# Patient Record
Sex: Male | Born: 1935 | Race: White | Hispanic: No | Smoking: Never smoker
Health system: Southern US, Community
[De-identification: ages and names within clinical notes are randomized; demographics above are authoritative.]

## PROBLEM LIST (undated history)

## (undated) DIAGNOSIS — Z7901 Long term (current) use of anticoagulants: Secondary | ICD-10-CM

## (undated) DIAGNOSIS — I2510 Atherosclerotic heart disease of native coronary artery without angina pectoris: Secondary | ICD-10-CM

## (undated) DIAGNOSIS — Z9981 Dependence on supplemental oxygen: Secondary | ICD-10-CM

## (undated) DIAGNOSIS — I350 Nonrheumatic aortic (valve) stenosis: Secondary | ICD-10-CM

## (undated) DIAGNOSIS — I714 Abdominal aortic aneurysm, without rupture, unspecified: Secondary | ICD-10-CM

## (undated) DIAGNOSIS — I4891 Unspecified atrial fibrillation: Secondary | ICD-10-CM

## (undated) HISTORY — DX: Long term (current) use of anticoagulants: Z79.01

## (undated) HISTORY — DX: Unspecified atrial fibrillation: I48.91

## (undated) HISTORY — DX: Nonrheumatic aortic (valve) stenosis: I35.0

## (undated) HISTORY — DX: Abdominal aortic aneurysm, without rupture: I71.4

## (undated) HISTORY — DX: Atherosclerotic heart disease of native coronary artery without angina pectoris: I25.10

## (undated) HISTORY — DX: Abdominal aortic aneurysm, without rupture, unspecified: I71.40

---

## 1992-11-14 HISTORY — PX: CORONARY ARTERY BYPASS GRAFT: SHX141

## 1995-11-15 HISTORY — PX: COLONOSCOPY: SHX174

## 2002-02-06 ENCOUNTER — Ambulatory Visit (HOSPITAL_COMMUNITY): Admission: RE | Admit: 2002-02-06 | Discharge: 2002-02-06 | Payer: Self-pay | Admitting: Pulmonary Disease

## 2002-07-12 ENCOUNTER — Ambulatory Visit (HOSPITAL_COMMUNITY): Admission: RE | Admit: 2002-07-12 | Discharge: 2002-07-12 | Payer: Self-pay | Admitting: Cardiology

## 2003-01-13 ENCOUNTER — Ambulatory Visit (HOSPITAL_COMMUNITY): Admission: RE | Admit: 2003-01-13 | Discharge: 2003-01-13 | Payer: Self-pay | Admitting: *Deleted

## 2003-10-15 ENCOUNTER — Ambulatory Visit (HOSPITAL_COMMUNITY): Admission: RE | Admit: 2003-10-15 | Discharge: 2003-10-15 | Payer: Self-pay | Admitting: Pulmonary Disease

## 2004-11-11 ENCOUNTER — Ambulatory Visit: Payer: Self-pay | Admitting: *Deleted

## 2005-10-28 ENCOUNTER — Ambulatory Visit: Payer: Self-pay | Admitting: *Deleted

## 2005-11-10 ENCOUNTER — Other Ambulatory Visit: Admission: RE | Admit: 2005-11-10 | Discharge: 2005-11-10 | Payer: Self-pay | Admitting: Dermatology

## 2006-05-08 ENCOUNTER — Ambulatory Visit (HOSPITAL_COMMUNITY): Admission: RE | Admit: 2006-05-08 | Discharge: 2006-05-08 | Payer: Self-pay | Admitting: Pulmonary Disease

## 2006-05-15 ENCOUNTER — Ambulatory Visit (HOSPITAL_COMMUNITY): Admission: RE | Admit: 2006-05-15 | Discharge: 2006-05-15 | Payer: Self-pay | Admitting: Pulmonary Disease

## 2006-12-25 ENCOUNTER — Ambulatory Visit: Payer: Self-pay | Admitting: Cardiovascular Disease

## 2007-01-09 ENCOUNTER — Ambulatory Visit (HOSPITAL_COMMUNITY): Admission: RE | Admit: 2007-01-09 | Discharge: 2007-01-09 | Payer: Self-pay | Admitting: Ophthalmology

## 2007-06-29 ENCOUNTER — Ambulatory Visit: Payer: Self-pay | Admitting: Vascular Surgery

## 2007-07-04 ENCOUNTER — Ambulatory Visit: Payer: Self-pay | Admitting: Vascular Surgery

## 2007-12-24 ENCOUNTER — Ambulatory Visit (HOSPITAL_COMMUNITY): Admission: RE | Admit: 2007-12-24 | Discharge: 2007-12-24 | Payer: Self-pay | Admitting: Pulmonary Disease

## 2008-01-09 ENCOUNTER — Ambulatory Visit: Payer: Self-pay | Admitting: Cardiovascular Disease

## 2008-01-12 ENCOUNTER — Ambulatory Visit (HOSPITAL_COMMUNITY): Admission: RE | Admit: 2008-01-12 | Discharge: 2008-01-12 | Payer: Self-pay | Admitting: Pulmonary Disease

## 2008-01-17 ENCOUNTER — Ambulatory Visit: Payer: Self-pay | Admitting: Cardiology

## 2008-01-17 ENCOUNTER — Ambulatory Visit (HOSPITAL_COMMUNITY): Admission: RE | Admit: 2008-01-17 | Discharge: 2008-01-17 | Payer: Self-pay | Admitting: Cardiovascular Disease

## 2008-06-18 ENCOUNTER — Ambulatory Visit: Payer: Self-pay | Admitting: Vascular Surgery

## 2008-06-19 ENCOUNTER — Ambulatory Visit: Payer: Self-pay | Admitting: Cardiology

## 2008-07-04 ENCOUNTER — Ambulatory Visit: Payer: Self-pay | Admitting: Cardiology

## 2009-07-03 ENCOUNTER — Telehealth (INDEPENDENT_AMBULATORY_CARE_PROVIDER_SITE_OTHER): Payer: Self-pay | Admitting: *Deleted

## 2009-07-03 ENCOUNTER — Ambulatory Visit: Payer: Self-pay | Admitting: Vascular Surgery

## 2009-07-07 ENCOUNTER — Telehealth: Payer: Self-pay | Admitting: Cardiology

## 2010-01-27 ENCOUNTER — Ambulatory Visit: Payer: Self-pay | Admitting: Cardiology

## 2010-08-31 ENCOUNTER — Ambulatory Visit (HOSPITAL_COMMUNITY)
Admission: RE | Admit: 2010-08-31 | Discharge: 2010-08-31 | Payer: Self-pay | Admitting: Physical Medicine and Rehabilitation

## 2010-11-14 DIAGNOSIS — I4891 Unspecified atrial fibrillation: Secondary | ICD-10-CM

## 2010-11-14 HISTORY — DX: Unspecified atrial fibrillation: I48.91

## 2010-11-24 ENCOUNTER — Inpatient Hospital Stay (HOSPITAL_COMMUNITY)
Admission: EM | Admit: 2010-11-24 | Discharge: 2010-11-25 | Payer: Self-pay | Source: Home / Self Care | Attending: Pulmonary Disease | Admitting: Pulmonary Disease

## 2010-11-25 ENCOUNTER — Encounter (INDEPENDENT_AMBULATORY_CARE_PROVIDER_SITE_OTHER): Payer: Self-pay | Admitting: Pulmonary Disease

## 2010-11-29 LAB — CBC
HCT: 35.8 % — ABNORMAL LOW (ref 39.0–52.0)
HCT: 33.9 % — ABNORMAL LOW (ref 39.0–52.0)
Hemoglobin: 12.7 g/dL — ABNORMAL LOW (ref 13.0–17.0)
Hemoglobin: 12.4 g/dL — ABNORMAL LOW (ref 13.0–17.0)
MCH: 32.5 pg (ref 26.0–34.0)
MCH: 33.2 pg (ref 26.0–34.0)
MCHC: 35.5 g/dL (ref 30.0–36.0)
MCHC: 36.6 g/dL — ABNORMAL HIGH (ref 30.0–36.0)
MCV: 91.6 fL (ref 78.0–100.0)
MCV: 90.9 fL (ref 78.0–100.0)
Platelets: 109 10*3/uL — ABNORMAL LOW (ref 150–400)
Platelets: 114 10*3/uL — ABNORMAL LOW (ref 150–400)
RBC: 3.91 MIL/uL — ABNORMAL LOW (ref 4.22–5.81)
RBC: 3.73 MIL/uL — ABNORMAL LOW (ref 4.22–5.81)
RDW: 12.9 % (ref 11.5–15.5)
RDW: 13 % (ref 11.5–15.5)
WBC: 4 10*3/uL (ref 4.0–10.5)
WBC: 5.3 10*3/uL (ref 4.0–10.5)

## 2010-11-29 LAB — POCT CARDIAC MARKERS
CKMB, poc: 2.6 ng/mL (ref 1.0–8.0)
Myoglobin, poc: 132 ng/mL (ref 12–200)
Troponin i, poc: <0.05 ng/mL (ref 0.00–0.09)

## 2010-11-29 LAB — BASIC METABOLIC PANEL
BUN: 28 mg/dL — ABNORMAL HIGH (ref 6–23)
BUN: 24 mg/dL — ABNORMAL HIGH (ref 6–23)
CO2: 26 mEq/L (ref 19–32)
CO2: 22 mEq/L (ref 19–32)
Calcium: 8.9 mg/dL (ref 8.4–10.5)
Calcium: 8.4 mg/dL (ref 8.4–10.5)
Chloride: 107 mEq/L (ref 96–112)
Chloride: 113 mEq/L — ABNORMAL HIGH (ref 96–112)
Creatinine, Ser: 1.41 mg/dL (ref 0.4–1.5)
Creatinine, Ser: 1.36 mg/dL (ref 0.4–1.5)
GFR calc Af Amer: 57 mL/min — ABNORMAL LOW (ref >60)
GFR calc Af Amer: 59 mL/min — ABNORMAL LOW (ref >60)
GFR calc non Af Amer: 47 mL/min — ABNORMAL LOW (ref >60)
GFR calc non Af Amer: 49 mL/min — ABNORMAL LOW (ref >60)
Glucose, Bld: 113 mg/dL — ABNORMAL HIGH (ref 70–99)
Glucose, Bld: 91 mg/dL (ref 70–99)
Potassium: 4.2 mEq/L (ref 3.5–5.1)
Potassium: 3.9 mEq/L (ref 3.5–5.1)
Sodium: 140 mEq/L (ref 135–145)
Sodium: 140 mEq/L (ref 135–145)

## 2010-11-29 LAB — CARDIAC PANEL(CRET KIN+CKTOT+MB+TROPI)
CK, MB: 4.5 ng/mL — ABNORMAL HIGH (ref 0.3–4.0)
Relative Index: 4.4 — ABNORMAL HIGH (ref 0.0–2.5)
Total CK: 103 U/L (ref 7–232)
Troponin I: 0.3 ng/mL — ABNORMAL HIGH (ref 0.00–0.06)

## 2010-11-29 LAB — DIFFERENTIAL
Basophils Absolute: 0 10*3/uL (ref 0.0–0.1)
Basophils Relative: 1 % (ref 0–1)
Eosinophils Absolute: 0.2 10*3/uL (ref 0.0–0.7)
Eosinophils Relative: 6 % — ABNORMAL HIGH (ref 0–5)
Lymphocytes Relative: 30 % (ref 12–46)
Lymphs Abs: 1.2 10*3/uL (ref 0.7–4.0)
Monocytes Absolute: 0.6 10*3/uL (ref 0.1–1.0)
Monocytes Relative: 15 % — ABNORMAL HIGH (ref 3–12)
Neutro Abs: 2 10*3/uL (ref 1.7–7.7)
Neutrophils Relative %: 49 % (ref 43–77)

## 2010-11-29 LAB — HEPARIN LEVEL (UNFRACTIONATED)
Heparin Unfractionated: 0.22 IU/mL — ABNORMAL LOW (ref 0.30–0.70)
Heparin Unfractionated: 0.1 IU/mL — ABNORMAL LOW (ref 0.30–0.70)

## 2010-11-29 LAB — HEPATIC FUNCTION PANEL
ALT: 15 U/L (ref 0–53)
AST: 19 U/L (ref 0–37)
Albumin: 3.1 g/dL — ABNORMAL LOW (ref 3.5–5.2)
Alkaline Phosphatase: 63 U/L (ref 39–117)
Bilirubin, Direct: 0.1 mg/dL (ref 0.0–0.3)
Indirect Bilirubin: 0.7 mg/dL (ref 0.3–0.9)
Total Bilirubin: 0.8 mg/dL (ref 0.3–1.2)
Total Protein: 5.2 g/dL — ABNORMAL LOW (ref 6.0–8.3)

## 2010-11-29 LAB — PROTIME-INR
INR: 1.04 (ref 0.00–1.49)
Prothrombin Time: 13.8 seconds (ref 11.6–15.2)

## 2010-11-29 LAB — MRSA PCR SCREENING: MRSA by PCR: NEGATIVE

## 2010-11-29 LAB — LIPID PANEL
Cholesterol: 120 mg/dL (ref 0–200)
HDL: 48 mg/dL (ref >39)
LDL Cholesterol: 62 mg/dL (ref 0–99)
Total CHOL/HDL Ratio: 2.5 RATIO
Triglycerides: 49 mg/dL (ref <150)
VLDL: 10 mg/dL (ref 0–40)

## 2010-11-29 LAB — BRAIN NATRIURETIC PEPTIDE: Pro B Natriuretic peptide (BNP): 147 pg/mL — ABNORMAL HIGH (ref 0.0–100.0)

## 2010-11-29 LAB — CK TOTAL AND CKMB (NOT AT ARMC)
CK, MB: 3.3 ng/mL (ref 0.3–4.0)
Relative Index: 3.3 — ABNORMAL HIGH (ref 0.0–2.5)
Total CK: 100 U/L (ref 7–232)

## 2010-11-29 LAB — APTT: aPTT: 25 seconds (ref 24–37)

## 2010-11-29 LAB — D-DIMER, QUANTITATIVE: D-Dimer, Quant: 1.02 ug/mL-FEU — ABNORMAL HIGH (ref 0.00–0.48)

## 2010-12-07 ENCOUNTER — Encounter: Payer: Self-pay | Admitting: Cardiology

## 2010-12-16 ENCOUNTER — Telehealth: Payer: Self-pay | Admitting: Cardiology

## 2010-12-16 NOTE — Assessment & Plan Note (Signed)
Summary: ROV   Visit Type:  Follow-up Primary Provider:  DR.EDWARD HAWKINS  CC:  NO CARDIOLOGY COMPLAINTS.  History of Present Illness: [name] returns today for evaluation and management of his coronary artery disease, history of coronary artery bypass in 1994, moderate aortic stenosis, hypertension, nonobstructive carotid disease followed by vascular surgery, abdominal aortic aneurysm followed by vascular surgery, and lower extremity edema from venous insufficiency.  He is 77 years old and still does his yard work. He denies any angina or ischemic symptoms. He's had no presyncope, syncope, palpitations. He denies any dyspnea on exertion.  He still has a lot of lower shin edema and has to wear support hose.  His blood work is followed by his primary care Dr. Juanetta Gosling  Current Medications (verified): 1)  Diltiazem Hcl Er Beads 240 Mg Xr24h-Cap (Diltiazem Hcl Er Beads) .... Take 1 Tab Daily 2)  Nitroglycerin 0.4 Mg Subl (Nitroglycerin) .... Place 1 Tablet Under Tongue As Directed 3)  Simvastatin 20 Mg Tabs (Simvastatin) .... Take 1 Tab Daily 4)  Daily Multi  Tabs (Multiple Vitamins-Minerals) .... Take 1 Tab Daily 5)  Fish Oil 300 Mg Caps (Omega-3 Fatty Acids) .... Take 1 Tab Daily 6)  Aspir-Trin 325 Mg Tbec (Aspirin) .... Take 1/3 of Asa 7)  Saw Palmetto 450 Mg Caps (Saw Palmetto (Serenoa Repens)) .... Take 1 Tab Daily 8)  Zolpidem Tartrate 10 Mg Tabs (Zolpidem Tartrate) .... Take 1 Tab At Bedtime 9)  Hydrocodone-Acetaminophen 5-500 Mg Tabs (Hydrocodone-Acetaminophen) .... Take Prn 10)  Flomax 0.4 Mg Caps (Tamsulosin Hcl) .... Take 1 Tab Daily  Allergies (verified): No Known Drug Allergies  Review of Systems       negative other than history of present illness  Vital Signs:  Patient profile:   77 year old male Height:      67 inches Weight:      158 pounds Pulse rate:   68 / minute BP sitting:   115 / 60  (right arm)  Vitals Entered By: Dreama Saa, CNA (January 27, 2010  3:45 PM)  Physical Exam  General:  looks younger than stated age Head:  normocephalic and atraumatic Neck:  Neck supple, no JVD. No masses, thyromegaly or abnormal cervical nodes. Chest Yarden Hillis:  no deformities or breast masses noted Lungs:  Clear bilaterally to auscultation and percussion. Heart:  regular rate and rhythm, normal S1, 3/6 systolic murmur consistent with aortic stenosis. S2 splits minimally. No significant diastolic murmur heard. Bilateral sounds at the bases neck is referred or bruits. Msk:  decreased ROM.   Pulses:  diminished in the lower extremities Extremities:  2+ left pedal edema and 2+ right pedal edema.  support Neurologic:  Alert and oriented x 3. Skin:  Intact without lesions or rashes. Psych:  Normal affect.   Problems:  Medical Problems Added: 1)  Dx of Aortic Stenosis/ Insufficiency, Non-rheumatic  (ICD-424.1) 2)  Dx of Cad, Artery Bypass Graft  (ICD-414.04)  Impression & Recommendations:  Problem # 1:  CAD, ARTERY BYPASS GRAFT (ICD-414.04) Assessment Unchanged  The following medications were removed from the medication list:    Lisinopril 10 Mg Tabs (Lisinopril) .Marland Kitchen... Take 1 tablet by mouth once a day His updated medication list for this problem includes:    Diltiazem Hcl Er Beads 240 Mg Xr24h-cap (Diltiazem hcl er beads) .Marland Kitchen... Take 1 tab daily    Nitroglycerin 0.4 Mg Subl (Nitroglycerin) .Marland Kitchen... Place 1 tablet under tongue as directed    Aspir-trin 325 Mg Tbec (Aspirin) .Marland Kitchen... Take 1/3  of asa  Problem # 2:  AORTIC STENOSIS/ INSUFFICIENCY, NON-RHEUMATIC (ICD-424.1) Assessment: Unchanged  The following medications were removed from the medication list:    Hydrochlorothiazide 12.5 Mg Tabs (Hydrochlorothiazide) .Marland Kitchen... Take 1 tablet by mouth once a day    Lisinopril 10 Mg Tabs (Lisinopril) .Marland Kitchen... Take 1 tablet by mouth once a day His updated medication list for this problem includes:    Nitroglycerin 0.4 Mg Subl (Nitroglycerin) .Marland Kitchen... Place 1 tablet under  tongue as directed  Patient Instructions: 1)  Your physician recommends that you schedule a follow-up appointment in: 12 months 2)  Your physician recommends that you continue on your current medications as directed. Please refer to the Current Medication list given to you today.

## 2010-12-20 NOTE — Consult Note (Addendum)
NAME:  Troy Hinton, Troy Hinton NO.:  [id number]  MEDICAL RECORD NO.:  [id number]          PATIENT TYPE:  INP  LOCATION:  A232                          FACILITY:  [facility]  PHYSICIAN:  Gerrit Friends. Dietrich Pates, MD, FACCDATE OF BIRTH:  Jan 04, 1917  DATE OF CONSULTATION:  11/24/2010 DATE OF DISCHARGE:                                CONSULTATION   PRIMARY CARDIOLOGIST:  Jesse Sans. Wall, MD, [facility].  PRIMARY CARE PHYSICIAN:  Edward L. Juanetta Gosling, M.D.  REASON FOR CONSULTATION:  Chest pain, Afib, RVR.  HISTORY OF PRESENT ILLNESS:  This is a 77 year old Caucasian male with history of coronary artery disease, CABG in 1994, AAA, hypertension who awoke with complaints of severe left-sided chest pain radiating to his left shoulder, described as sharp.  He took nitroglycerin x4.  He states that he took his blood pressure and it was found to be very elevated at over 200 systolically.  On arrival to the emergency room, the patient states the pain was relieved before arriving.  EKG completed revealing AFib with RVR with a rate of 114 beats per minute.  The patient was given 10 mg of IV Cardizem and started on drip at 5, which has now been titrated to 10 mg an hour for continued elevation in heart rate into the 110s and 120s.  The patient is currently resting without any complaints. His breathing status is stable and he has had no further recurrence of chest pain.  REVIEW OF SYSTEMS:  Positive for chest pain, shortness of breath.  All other systems are reviewed and are found to be negative.  PAST MEDICAL HISTORY: 1. CAD status pars five-vessel CABG in 1994, records are being     obtained for specifics. 2. Moderate aortic valve stenosis.  a:  Most recent echocardiogram dated March 2004 with mild aortic valve     stenosis, valve area of 1.6 sq cm with a peak gradient of 22 mmHg. 3. Venous insufficiency.  a:  Status post saphenous vein graft harvest from the right leg with     chronic lower  extremity edema and use of TED hose. 4. Hypertension. 5. Hyperlipidemia. 6. History of AAA with last ultrasound in August 2010 with 2.8 x 3.2     cm at its widest point.  PAST SURGICAL HISTORY:  Coronary artery bypass grafting.  SOCIAL HISTORY:  He lives in [place] with his wife.  He is retired. He does not smoke, drink, or use drugs.  FAMILY HISTORY:  Coronary artery disease, most prominent on his father's side.  CURRENT MEDICATIONS:  At home, aspirin 81 mg daily, simvastatin 20 mg daily, diltiazem 240 mg daily, tamsulosin 0.4 mg daily, felodipine p.r.n., fish oil daily.  ALLERGIES:  No known drug allergies.  CURRENT LABS:  Sodium 140, potassium 4.2, chloride 107, CO2 26, BUN 28, creatinine 1.4, glucose 113, GFR 47, hemoglobin 12.7, hematocrit 35.8, white blood cells 4.0, platelets 109.  PTT 25, PT 13.8, INR 1.0, troponin 0.05.  Chest x-ray revealing borderline cardiac size with no pulmonary edema, pneumonia, or other acute process.  EKG revealing atrial fibrillation with a rate of 108 beats per minute with  no evidence of ischemia seen.  PHYSICAL EXAMINATION:  VITAL SIGNS:  Blood pressure 94/62, pulse 100, respirations 22, O2 sat 98% on 2 L. GENERAL:  He is awake, alert, and oriented.  No acute distress. HEENT:  Head is normocephalic and atraumatic.  Eyes, PERRLA. NECK:  Supple.  He has positive carotid bruit.  No JVD is seen. CARDIOVASCULAR:  Irregular rhythm with holosystolic coarse 2/6 systolic murmur without rubs or gallops.  Pulses are 2+ and equal without bruits. LUNGS:  Some mild crackles but essentially clear to auscultation. ABDOMEN:  Soft, nontender with 2+ bowel sounds. EXTREMITIES:  There is no clubbing or cyanosis.  He does have 2+ right lower extremity edema which is chronic on his right and 1+ left ankle edema noted. NEUROLOGIC:  Cranial nerves II-XII are grossly intact with the exception he is very hard of hearing.  IMPRESSION: 1. Atrial fibrillation,   rapid ventricular response (new onset) with     associated chest pain, now relieved with nitroglycerin, now on     Cardizem drip at 10 mg an hour with better heart rate control.     Initial cardiac enzymes are negative x1.  He is currently on     heparin drip and is comfortable.  We will get echocardiogram for     evaluation and changes in has left ventricular function. 2. Coronary artery disease status post coronary artery bypass grafting     in 1994.  We will review records for specifics.  He is currently on     aspirin and calcium channel blocker at this time.  There is no     evidence of ischemia causing events of atrial fibrillation at this     time. 3. History of abdominal aortic aneurysm, most recent ultrasound was in     August 2010.  It was stable at that time.  We will repeat his     ultrasound as he is due for a followup in February 2012. 4. Hypertension, currently controlled. 5. Hyperlipidemia.  We will check lipids and LFTs during this     admission.  PLAN:  This is a 77 year old Caucasian male who presented to the emergency room with left-sided chest pain radiating to shoulder with Afib, RVR with known history of CAD and coronary artery bypass grafting. Currently, he is stable.  Cardiac enzymes initially are negative.  His EKG does not show any acute ischemic event.  We will continue to cycle enzymes.  Check echo for changes in LV function and to assess his aortic valve stenosis and we will follow.  More recommendations per Dr. Dietrich Pates per hospital course.  On behalf the physicians and providers of [facility], we would like to thank Dr. Juanetta Gosling for allowing [place] to participate in the care of this patient.     Bettey Mare. Lyman Bishop, NP   ______________________________ Gerrit Friends. Dietrich Pates, MD, [facility]    KML/MEDQ  D:  11/24/2010  T:  11/25/2010  Job:  [id number]  cc:   Ramon Dredge L. Juanetta Gosling, M.D. Fax: [phone]  Electronically Signed by Joni Reining NP on  11/29/2010 01:21:41 PM Electronically Signed by [place] Bing MD FACC on 12/20/2010 08:16:21 AM

## 2010-12-22 NOTE — Letter (Signed)
Summary: CLEARANCE FROM DR [facility]  CLEARANCE FROM DR Dimas Millin   Imported By: Faythe Ghee 12/07/2010 14:59:14  _____________________________________________________________________  External Attachment:    Type:   Image     Comment:   External Document  Appended Document: CLEARANCE FROM DR Dimas Millin cleared for surgery.  Reviewed Juanito Doom, MD

## 2010-12-30 NOTE — Progress Notes (Signed)
Summary: pt having surgery on monday needs to know about pradaxa  Phone Note From Other Clinic Call back at [phone]   Caller: dr zaldivar Request: Talk with Nurse Summary of Call: patient is telling them that he was put on pradaxa at hospital by Dr Cletis Media. Patient is having eye lid surgery on Monday 12/20/10 and needs to know if he should come off this for procedure. Dr Daleen Squibb has al;ready cleared him for surgery but nothing was mentioned about pradaxa. Initial call taken by: Faythe Ghee,  December 16, 2010 11:54 AM  Follow-up for Phone Call        Pt was discharge from [facility] on 11/25/2010  on Pradaxa 150mg  two times a day how many days should he how this prior to his eye lid surgery. Usual hold time is 48hrs prior and restart the evening of procedure???? Follow-up by: Teressa Lower RN,  December 16, 2010 12:28 PM  Additional Follow-up for Phone Call Additional follow up Details #1::        He needs to hold for 48hrs.....coordinate thru Vashti Hey. Additional Follow-up by: Gaylord Shih, MD, [facility],  December 21, 2010 8:57 AM     Appended Document: pt having surgery on monday needs to know about pradaxa Spoke with pt, had surgery yesterday, did not hold pradaxa.  Pt is doing well and has no c/o

## 2011-03-29 NOTE — Assessment & Plan Note (Signed)
OFFICE VISIT   Troy Hinton, Troy Hinton  DOB:  08-13-1917                                       06/29/2007  ZOXWR#:[id number]   The patient is in today for followup of his small infrarenal abdominal  aortic aneurysm.  He is here today with his daughter.  He continues to  be quite active with no new major medical difficulties.  He continues to  be a nonsmoker and does not drink alcohol.   REVIEW OF SYSTEMS:  Positive only for diarrhea, constipation, occasional  urinary frequency and arthritis.  He has no known drug allergies.   MEDICATIONS:  1. Lisinopril.  2. Diltiazem.  3. Simvastatin.  4. Multivitamins.  5. Omega 3 vitamins.   He has no symptoms referable to his aneurysm.  He is also concerned  regarding possible carotid disease with a friend having a severe carotid  stenosis.  He does not have any prior neurological deficits.   PHYSICAL EXAMINATION:  Blood pressure is 162/79, pulse 55, respirations  18.  He is grossly intact neurologically, he does have a soft right  carotid bruit, no bruit on the left.  His radial pulses are 2+  bilaterally.  He is thin with a prominent aortic pulsation and no  tenderness.  He has 2+ femoral and 2+ popliteal pulses bilaterally with  no evidence of peripheral aneurysms.  He underwent repeat ultrasound  today and this reveals no change with a maximal diameter of 3.5 cm  infrarenal abdominal aortic aneurysm, this is similar to his prior  study.  I reassured the patient and his daughter regarding this.  I have  recommended that we see him in 2 years with a repeat ultrasound at that  time.  I explained that with his small size and advanced age it would be  quite unlikely if he should ever come to repair.  Also due to his  concern, also soft bruit, we will proceed with carotid duplex as an  outpatient at his convenience and will notify him of his results.   Larina Earthly, M.D.  Electronically Signed   TFE/MEDQ  D:   06/29/2007  T:  07/02/2007  Job:  [id number]   cc:   Ramon Dredge L. Juanetta Gosling, M.D.

## 2011-03-29 NOTE — Assessment & Plan Note (Signed)
[facility]                        CARDIOLOGY OFFICE NOTE   BRENTYN, SEEHAFER                      MRN:          [id number]  DATE:01/09/2008                            DOB:          1917-03-14    Hal returns today for follow-up.   He has had some shortness of breath lately.  He apparently saw Dr.  Juanetta Gosling and is being treated for pneumonia.  I do not have that current  chest x-ray. The patient has a history of aortic stenosis but has not  had a follow-up echo in 5 years.  I am not quite sure why but he has  been a little reticent to do this.   His last echo in January 2004 showed mild aortic stenosis with a mean  gradient of 13 mmHg.   The patient had a normal ejection fraction with mild anterior-posterior  hypokinesis. I am not sure what this means. It was read by Dr. Dorethea Clan   The patient has a history of COPD.  He has not had a high fever but he  has had a cough and clean sputum production.  He is to pick up  antibiotics today per Dr. Juanetta Gosling. I  explained to him that he needs a  follow-up echo for [place] heart even though he is 77 years old, he is in  good shape and I would be curious as to what component of the AS is  contributing to his shortness of breath.  He also does have some lower  extremity edema which is slightly worse.   He has not had any palpitations, chest pain, PND, or orthopnea.   His review of systems is otherwise negative.   CURRENT MEDICATIONS:  1. Lisinopril 10 mg a day.  2. Cardizem 240 a day.  3. Saw Palmetto.  4. Omega 3 fish oil.  5. Zocor 20 a day.  6. Aspirin a day.  7. Hydrochlorothiazide 12-1/2 a day to be added.   PHYSICAL EXAMINATION:  An elderly white male in no distress.  His blood pressure is 120/70, pulse 66 and regular, respiratory rate 14,  afebrile.  HEENT:  Unremarkable.  He has a left carotid bruit.  NECK:  Supple.  No JVP elevation or lymphadenopathy. He has no parvus et  tardus.  LUNGS:  Clear to my exam with no active wheezing.  I cannot hear air  bronchograms or evidence of a consolidative pneumonia.  HEART:  He has an S1 second heart sound that is more muffled than  previous with a mid peaking systolic murmur of AS. PMI is normal.  ABDOMEN:  Benign. Bowel sounds positive, no AAA. No hepatosplenomegaly  or hepatojugular reflux.  Distal pulses are intact. He has +1 edema bilaterally, right greater  than left.  NEURO:  Nonfocal.  SKIN:  Warm and dry.  No muscle weakness.   IMPRESSION:  1. Previous history of coronary artery bypass graft in 1994 not having      chest pain. Given advanced age, no need for follow-up Myoview.      Continue aspirin therapy.  2. Aortic stenosis may be contributing to his  dyspnea. Check 2-D      echocardiogram. Avoid afterload reduction.  3. Pneumonia diagnosed by Dr. Juanetta Gosling. Will try to review chest x-ray      on the computer. The patient is to pick up antibiotics today and      see Dr. Juanetta Gosling on Monday.  4. Lower extremity edema likely due to venous insufficiency status      post stripping from coronary bypass surgery.  Add      hydrochlorothiazide 12.5 a day.  5. Apparently the patient has seen Dr. Arbie Cookey. He has left carotid      bruit.  Will try to get records from Dr. early regarding his other      vascular disease.  He is not having TIAs.  Continue aspirin      therapy.  6. Hypertension currently well-controlled.  Continue lisinopril 10 mg      a day unless aortic stenosis has progressed to severe. Continue a      low salt diet.  7. Hyperlipidemia.  Continue Zocor 20 a day.  Lipid and liver profile      in 6 months.     Noralyn Pick. Eden Emms, MD, [facility]  Electronically Signed    PCN/MedQ  DD: 01/09/2008  DT: 01/10/2008  Job #: [id number]

## 2011-03-29 NOTE — Procedures (Signed)
CAROTID DUPLEX EXAM   INDICATION:  Bruit.   HISTORY:  Diabetes:  No.  Cardiac:  No.  Hypertension:  No.  Smoking:  No.  Previous Surgery:  No.  CV History:  Amaurosis Fugax Yes No, Paresthesias Yes No, Hemiparesis Yes No                                       RIGHT             LEFT  Brachial systolic pressure:         110               100  Brachial Doppler waveforms:         Biphasic          Biphasic  Vertebral direction of flow:        Antegrade         Antegrade  DUPLEX VELOCITIES (cm/sec)  CCA peak systolic                   88                89  ECA peak systolic                   94                172  ICA peak systolic                   86                94  ICA end diastolic                   21                17  PLAQUE MORPHOLOGY:                  Calcified         Calcified  PLAQUE AMOUNT:                      Mild              Mild  PLAQUE LOCATION:                    ICE and ECA       ICE and ECA   IMPRESSION:  20 to 39% stenosis noted in bilateral ICA's.  Antegrade  bilateral vertebral arteries.   ___________________________________________  Larina Earthly, M.D.   MG/MEDQ  D:  07/04/2007  T:  07/05/2007  Job:  [id number]

## 2011-03-29 NOTE — Assessment & Plan Note (Signed)
[facility]                       [place] CARDIOLOGY OFFICE NOTE   AMBROSIO, REUTER                      MRN:          [id number]  DATE:07/04/2008                            DOB:          1917-08-02    Troy Hinton comes in today for further followup.   PROBLEM LIST:  1. Coronary artery disease.  He is currently having no symptoms with      angina or ischemia.  The coronary bypass grafting in 1994.  His      last stress Myoview was low risk with an EF of 65%.  2. Moderate aortic stenosis by 2-D echocardiogram, January 17, 2008.      This may be contributing to his dyspnea on exertion.  3. Lower extremity edema secondary to venous insufficiency post bypass      surgery.  This has always been worse in the right lower extremity.  4. Cerebrovascular disease.  He is followed by Dr. Colin Benton.  He is      having no symptoms of TIAs.  5. Hypertension.  Recently his blood pressure was low and we stopped      his lisinopril.  6. Hyperlipidemia.  7. Small abdominal aortic aneurysm less than 4 cm, asymptomatic.   His daughter is concerned about his blood pressure being higher or  potentially higher off lisinopril.  It is 118/84 today.  She is also  concerned about his heart rate being up.  It is 65 today as opposed to  in the 60s in the past.   He denies any orthopnea, PND, peripheral edema other than his baseline  right lower extremity edema.  He has had no presyncope or syncope.  He  has had no angina.   MEDICATIONS:  1. Lasix 40 mg a day.  2. Multivitamin daily.  3. Aspirin 325 mg a day, half nightly.  4. Zocor 20 mg a day.  5. Diltiazem 180 mg a day as it was 240 once a day.  6. Fish oil.   PHYSICAL EXAMINATION:  VITAL SIGNS:  His blood pressure is 118/84, his  pulse is 78 and regular, and his weight is 143.  HEENT:  Normocephalic, atraumatic.  PERRLA.  Extraocular movements  intact.  Sclerae clear.  Fascial asymmetry is normal.  He has lost  considerable weight since I last saw him.  NECK:  Supple.  Carotid upstrokes were equal bilaterally with a soft  left carotid bruit.  Thyroid is not enlarged.  Trachea is midline.  LUNGS:  Clear.  HEART:  Non-displaced PMI, soft systolic murmur, S2 splits.  ABDOMEN:  Soft, good bowel sounds.  No pulsatile mass.  EXTREMITIES:  1+ pitting edema on the right.  Pulses are intact, left is  trace, pulses intact.  No sign of DVT.  NEUROLOGIC:  Grossly intact.   I had a long talk with Troy Hinton and his daughter.  We will increase  his diltiazem from 180 to 240 a day.  We will leave his lisinopril off.  I will see him back in 6 months.     Thomas C. Daleen Squibb, MD, [facility]  Electronically Signed  TCW/MedQ  DD: 07/04/2008  DT: 07/04/2008  Job #: [id number]   cc:   Ramon Dredge L. Juanetta Gosling, M.D.

## 2011-03-29 NOTE — Procedures (Signed)
DUPLEX ULTRASOUND OF ABDOMINAL AORTA   INDICATION:  Followup abdominal aortic aneurysm.   HISTORY:  Diabetes:  No.  Cardiac:  MI, CABG.  Hypertension:  No.  Smoking:  No.  Connective Tissue Disorder:  Family History:  Mother and father.  Previous Surgery:  No AAA surgery.   DUPLEX EXAM:         AP (cm)                   TRANSVERSE (cm)  Proximal             1.85 cm                   1.83 cm  Mid                  2.68 cm                   3.52 cm  Distal               2.02 cm                   2.24 cm  Right Iliac          1.06 cm                   1.12 cm  Left Iliac           1.09 cm                   1.04 cm   PREVIOUS:  Date:  06/29/2007  AP:  2.7  TRANSVERSE:  3.5   IMPRESSION:  Stable abdominal aortic aneurysm with largest measurement  of 2.68 cm x 3.52 cm with thrombus noted.   ___________________________________________  Larina Earthly, M.D.   AS/MEDQ  D:  06/18/2008  T:  06/18/2008  Job:  [id number]

## 2011-03-29 NOTE — Procedures (Signed)
DUPLEX ULTRASOUND OF ABDOMINAL AORTA   INDICATION:  Followup of known abdominal aortic aneurysm. In July of  2007, the patient was found to have a 3.2 x 3.5 cm aneurysm at [facility].   HISTORY:  Diabetes:  No  Cardiac:  No  Hypertension:  No  Smoking:  No  Connective Tissue Disorder:  Family History:  Previous Surgery:   DUPLEX EXAM:         AP (cm)                   TRANSVERSE (cm)  Proximal             1.7 cm                    2.0 cm  Mid                  2.7 cm                    3.5 cm  Distal               2.0 cm                    2.1 cm  Right Iliac          0.90 cm                   0.90 cm  Left Iliac           0.86 cm                   0.71 cm   PREVIOUS:  Date:  AP:  3.2  TRANSVERSE:  3.5   IMPRESSION:  1. Stable appearing infrarenal AAA with maximum diameter of 2.7 cm AP      x 3.5 cm transverse with      moderate non-flow-restricting thrombus.  2. Bilateral common iliac arteries are without abnormal aneurysmal      dilatation.   ___________________________________________  Larina Earthly, M.D.   AR/MEDQ  D:  06/29/2007  T:  06/30/2007  Job:  [id number]

## 2011-03-29 NOTE — Procedures (Signed)
DUPLEX ULTRASOUND OF ABDOMINAL AORTA   INDICATION:  Abdominal aortic aneurysm.   HISTORY:  Diabetes:  No.  Cardiac:  MI, CABG.  Hypertension:  No.  Smoking:  No.  Connective Tissue Disorder:  Family History:  Mother and father had aneurysms.  Previous Surgery:  No.   DUPLEX EXAM:         AP (cm)                   TRANSVERSE (cm)  Proximal             2.5 cm                    2.7 cm  Mid                  2.0 cm                    2.2 cm  Distal               2.8 cm                    3.2 cm  Right Iliac          1.4 cm                    1.3 cm  Left Iliac           1.1 cm                    1.4 cm   PREVIOUS:  Date: 06/18/2008  AP:  2.62  TRANSVERSE:  3.52   IMPRESSION:  Stable aneurysm measurements of the mid to distal abdominal  aorta noted when compared to the previous exam.   ___________________________________________  Larina Earthly, M.D.   CH/MEDQ  D:  07/06/2009  T:  07/06/2009  Job:  [id number]

## 2011-04-01 NOTE — Procedures (Signed)
   NAME:  Troy Hinton, Troy Hinton                         ACCOUNT NO.:  [id number]   MEDICAL RECORD NO.:  [id number]                   PATIENT TYPE:  OUT   LOCATION:  RAD                                  FACILITY:  [facility]   PHYSICIAN:  Vida Roller, M.D.                DATE OF BIRTH:  1917-11-01   DATE OF PROCEDURE:  01/13/2003  DATE OF DISCHARGE:                                  ECHOCARDIOGRAM   REFERRING PHYSICIAN:  Edward L. Juanetta Gosling, M.D.   PROCEDURE:  Echocardiogram.  Tape #[id number], tape count [id number] through 5002.   REASON FOR PROCEDURE:  An 77 year old gentleman with coronary artery disease  and a bypass surgery.  Quality of the study is good.   M-MODE MEASUREMENTS:  1. The aorta is 34 mm.  2. The left atrium is 35 mm.  3. The septum is 14 mm, which is enlarged.  4. The posterior wall is 10 mm.  5. A left ventricular diastolic dimension of 43 mm.  6. A left ventricular systolic dimension of 29 mm.   2-D AND DOPPLER IMAGING:  1. The left ventricle is normal size with normal systolic function.  There     is evidence of mild anteroposterior hypokinesis but the overall ejection     fraction is normal.  Diastolic function was not assessed.  2. The right ventricle is normal size with normal systolic function.  3. Both atria are normal size.  There is no atrioseptal defect.  4. The aortic valve is sclerotic and appears to have mild aortic     insufficiency.  There is evidence of mild aortic stenosis with an     estimated valve area of 1.6 sq cm with a peak gradient of 22 mm and a     mean gradient of 13 mmHg.  5. The mitral valve is morphologically unremarkable with mild mitral     regurgitation.  6. The tricuspid valve is not well seen.  7. The pulmonic valve is not well seen.  8. The pericardial structures appear normal.  9. The ascending aorta was not well seen.                                               Vida Roller, M.D.    JH/MEDQ  D:  01/13/2003  T:  01/14/2003  Job:   [id number]

## 2011-04-01 NOTE — Procedures (Signed)
   NAME:  Troy Hinton, Troy Hinton                         ACCOUNT NO.:  [id number]   MEDICAL RECORD NO.:  [id number]                   PATIENT TYPE:  OUT   LOCATION:  RAD                                  FACILITY:  [facility]   PHYSICIAN:  Maisie Fus C. Wall, M.D. [facility]            DATE OF BIRTH:  1917/01/16   DATE OF PROCEDURE:  DATE OF DISCHARGE:                                    STRESS TEST   BRIEF HISTORY:  The patient is a pleasant 77 year old male with a history of  coronary artery bypass graft surgery in 1994 with a LIMA to the LAD,  sequential saphenous vein graft to the RCA/PDA, and a sequential saphenous  vein graft to the OD, circumflex, and OM.  His last Cardiolite was an  exercise Cardiolite performed in June 2000.  It revealed no ischemia, EF  44%.  He also has a history of elevated lipids.  He was seen in the office  on June 26, 2002 with some symptoms of chest pain.  He was scheduled for  an adenosine Cardiolite to further evaluate his symptoms.   Prior to the study today, the patient had no complaints of chest pain.  His  baseline EKG showed sinus rhythm, rate 54 beats per minute without ischemic  changes.  Blood pressure was 120/70.   Adenosine was administered at minutes 1-4.  Cardiolite was given at 3  minutes.  The patient did develop some flushing, some mild shortness of  breath, and some mild chest tightness.  These symptoms resolved in recovery.  There were no EKG changes.  The images are pending at time of this  dictation.         Delton See, P.A. [facility]                  Thomas C. Daleen Squibb, M.D. [facility]    DR/MEDQ  D:  07/12/2002  T:  07/12/2002  Job:  [id number]   cc:   Fredirick Maudlin, M.D.

## 2011-04-01 NOTE — Assessment & Plan Note (Signed)
[facility]                       T[facility] CARDIOLOGY OFFICE NOTE   Troy, Hinton                      MRN:          [id number]  DATE:12/25/2006                            DOB:          1917-08-12    Troy Hinton is seen today at the request of Dr. Nile Riggs.  He needs preop  clearance for cataract surgery.  The patient has had previous coronary  bypass surgery back in 1994.   He has not had any recent Myoview testing.  He also has a history of a  mild aortic stenosis by echo. Coronary risk factors include hypertension  and hypercholesterolemia.  He has been doing well.  He is functional  Class I.  He is not having significant exertional angina, chest pain,  dizziness, palpitations, PND, or orthopnea.   He has had previous cataract surgery at [facility] many years ago.  He  had no complications with this.  He chose to have  Dr. Nile Riggs do his surgery, as he has an office in  [place] and is  more convenient.  she surgery is actually scheduled for tomorrow.   He has not had to take any sublingual nitroglycerin.   CURRENT MEDICATIONS:  Include:  1. An aspirin daily.  2. Zocor 20 daily.  3. Lisinopril 10 daily.  4. Cardizem 240 daily.  5. Saw Palmetto.   PAST MEDICAL HISTORY:  Otherwise includes some mild COPD and  hyperlipidemia.   The patient also has a history of an abdominal aneurysm.  He had an  ultrasound dated May 15, 2006 which appeared to show abdominal aortic  aneurysm of 3.5 cm with mild by iliac enlargement.  He sees Dr. Arbie Cookey  for this.   The patient had some preop lab testing done.  His LDL cholesterol was  excellent at 58, PSA was 2.32 and he had a preop EKG done on February 7  which was entirely normal, sinus rhythm at a rate of 54.   FAMILY HISTORY:  Is remarkable for coronary disease in his father's  side.   The patient denies any allergies.   His primary care doctor is Dr. Jenene Slicker.  The patient is still  active,  he is retired from the heating and air business.  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  He has had decreasing vision in the left eye which is  necessitating his cataract surgery.   EXAMINATION:  Is remarkable  for a blood pressure  of 128/64, pulse 78  and regular.  HEENT:  Is normal.  Carotids have no parvus and no tardus.  LUNGS:  Are clear.  There is an S1, S2 with a mild AS murmur, second heart sound is  preserved.  Abdominal aorta is palpable but not tender.  There is no bruits.  Distal  pulses are intact with no edema.   IMPRESSION:  History of distant coronary artery bypass disease in 1994  without any significant angina and good functional status. Last Myoview  seen in the chart from 1999, which was a low risk.   The patient will need a follow up echo in 6 months to  assess his aortic  stenosis; however, it does not appear critical at this point.  He is to  undergo his low risk cataract surgery tomorrow.  I think he will be fine  for this.  We will continue his current medications in regard to blood  pressure control.  As his AS worsens, he made need to switch from  Lisinopril to a different medicine.  We will also continue his Cardizem  in regards to his hyperlipidemia, his LDL is below 60 on Zocor and we  will continue this.   He was given a note today to clear him for cataract surgery and I will  see him back in 6 months.     Noralyn Pick. Eden Emms, MD, [facility]  Electronically Signed    PCN/MedQ  DD: 12/25/2006  DT: 12/25/2006  Job #: [id number]

## 2011-04-20 ENCOUNTER — Telehealth: Payer: Self-pay | Admitting: Cardiology

## 2011-04-20 ENCOUNTER — Ambulatory Visit (INDEPENDENT_AMBULATORY_CARE_PROVIDER_SITE_OTHER): Payer: Medicare Other | Admitting: Physician Assistant

## 2011-04-20 ENCOUNTER — Encounter: Payer: Self-pay | Admitting: Physician Assistant

## 2011-04-20 DIAGNOSIS — I209 Angina pectoris, unspecified: Secondary | ICD-10-CM | POA: Insufficient documentation

## 2011-04-20 DIAGNOSIS — I5031 Acute diastolic (congestive) heart failure: Secondary | ICD-10-CM

## 2011-04-20 DIAGNOSIS — I509 Heart failure, unspecified: Secondary | ICD-10-CM

## 2011-04-20 DIAGNOSIS — I4891 Unspecified atrial fibrillation: Secondary | ICD-10-CM

## 2011-04-20 MED ORDER — NEBIVOLOL HCL 10 MG PO TABS: 10.0000 mg | ORAL_TABLET | Freq: Every day | ORAL | Status: DC

## 2011-04-20 MED ORDER — FUROSEMIDE 20 MG PO TABS: 20.0000 mg | ORAL_TABLET | Freq: Every day | ORAL | Status: DC

## 2011-04-20 MED ORDER — POTASSIUM CHLORIDE 10 MEQ PO TBCR: 10.0000 meq | EXTENDED_RELEASE_TABLET | Freq: Every day | ORAL | Status: DC

## 2011-04-20 NOTE — Patient Instructions (Addendum)
Your physician has recommended you make the following change in your medication: increase Bytolic to 10mg  daily, start taking Lasix 20mg  and Potassium daily.  Please call this office on Friday @ [phone] to let us know how you are feeling  Your physician recommends that you schedule a follow-up appointment in: 1 week

## 2011-04-20 NOTE — Assessment & Plan Note (Signed)
Patient has a history of normal LV function but does have elevated JVD, crackles in his lung bases, and lower extremity edema.I will give him low-dose Lasix 20 mg daily and potassium 10 mEq daily.

## 2011-04-20 NOTE — Telephone Encounter (Signed)
Patient has appt @ 1:30 / pt's daughter would like f/u call regarding outcome of this appt / tg

## 2011-04-20 NOTE — Assessment & Plan Note (Signed)
Patient has atrial fibrillation with rates over 100. I think this is contributing to most of his problems including mild heart failure and angina. I will increase his diastolic to 10 mg daily. He is to call us Friday and let us know if he's feeling better.

## 2011-04-20 NOTE — Assessment & Plan Note (Signed)
Patient is having left shoulder pain which is his anginal equivalent. He has not used nitroglycerin for this. I believe his rapid atrial fibrillation may be contributing to this. He does have coronary artery disease status post CABG in 1994. I asked him to use nitroglycerin if needed. He is to call us if he has any prolonged angina. Hopefully with better heart rate control this will take care of it.

## 2011-04-20 NOTE — Progress Notes (Signed)
HPI: This is a very pleasant 77-year-old white male patient who was referred to [place] by Dr. Juanetta Gosling for further evaluation of atrial fibrillation with rapid ventricular response. He had an admission to the hospital back in January which time he was found to be in atrial fib. He was treated with Cardizem and has since been put on by systolic. His heart continues to be over 100 beats per minute. He also complains of several month history of dyspnea on exertion walking to get the mail. He's also started to have some left shoulder pain at rest or with exertion which is his typical angina. Today it lasted approximately one hour but he did not take a nitroglycerin.  The patient has a history of coronary artery disease status post CABG in 1994, moderate aortic stenosis, hypertension, chronic lower extremity edema and abdominal aortic aneurysm followed by vascular surgery. He had a 2-D echo in January 2012 that showed mild to moderate concentric hypertrophy hyperdynamic systolic function ejection fraction 75% moderate aortic stenosis.  No Known Allergies  Current Outpatient Prescriptions on File Prior to Visit  Medication Sig Dispense Refill  . diltiazem (CARDIZEM CD) 240 MG 24 hr capsule Take 240 mg by mouth daily.        Marland Kitchen HYDROcodone-acetaminophen (VICODIN) 5-500 MG per tablet Take 1 tablet by mouth every 6 (six) hours as needed.        . Multiple Vitamins-Minerals (MULTIVITAMIN WITH MINERALS) tablet Take 1 tablet by mouth daily.        . nitroGLYCERIN (NITROSTAT) 0.4 MG SL tablet Place 0.4 mg under the tongue every 5 (five) minutes as needed.        . Omega-3 Fatty Acids (FISH OIL) 300 MG CAPS Take 1 capsule by mouth daily.        . simvastatin (ZOCOR) 20 MG tablet Take 20 mg by mouth at bedtime.        . Tamsulosin HCl (FLOMAX) 0.4 MG CAPS Take 0.4 mg by mouth daily.        Marland Kitchen zolpidem (AMBIEN) 10 MG tablet Take 10 mg by mouth at bedtime as needed.        Marland Kitchen DISCONTD: aspirin 325 MG tablet Take 325 mg by  mouth daily.       Marland Kitchen DISCONTD: Saw Palmetto, Serenoa repens, 450 MG CAPS Take 1 capsule by mouth daily.          Past Medical History  Diagnosis Date  . Aortic stenosis     insufficiency ,non-rheumatic  . CAD (coronary artery disease)     artery bypass graft    Past Surgical History  Procedure Date  . Coronary artery bypass graft 1994    No family history on file.  History   Social History  . Marital Status: Married    Spouse Name: N/A    Number of Children: N/A  . Years of Education: N/A   Occupational History  . retired    Social History Main Topics  . Smoking status: Never Smoker   . Smokeless tobacco: Never Used  . Alcohol Use: No  . Drug Use: No  . Sexually Active: Not on file   Other Topics Concern  . Not on file   Social History Narrative  . No narrative on file    ROS: See HPI Eyes: Negative Ears:Negative for hearing loss, tinnitus Cardiovascular: positive for dyspnea and dyspnea on exertion,Negative for chest pain, palpitations,irregular heartbeat, near-syncope, orthopnea, paroxysmal nocturnal dyspnia and syncope, claudication, cyanosis,.  Respiratory:  Negative for cough, hemoptysis, sleep disturbances due to breathing, sputum production and wheezing.   Endocrine: Negative for cold intolerance and heat intolerance.  Hematologic/Lymphatic: Negative for adenopathy and bleeding problem. Does not bruise/bleed easily.  Musculoskeletal: Negative.   Gastrointestinal: Negative for nausea, vomiting, reflux, abdominal pain, diarrhea, constipation.   Genitourinary: Negative for bladder incontinence, dysuria, flank pain, frequency, hematuria, hesitancy, nocturia and urgency.  Neurological: Negative.  Allergic/Immunologic: Negative for environmental allergies.   PHYSICAL EXAM Well-nournished, in no acute distress. Neck: increased JVD,no HJR, Bruit, or thyroid enlargement Lungs: decreased breath sounds with a few crackles at the bases,No tachypnea, clear  without wheezing, or rhonchi Cardiovascular: irregular irregular at 105 beats per minute, PMI not displaced,2/6 systolic murmur at the left sternal border, no gallops, bruit, thrill, or heave. Abdomen: BS normal. Soft without organomegaly, masses, lesions or tenderness. Extremities: +3 bilateral lower extremity edema decreased distal pulses bilaterally. SKin: Warm, no lesions or rashes  Musculoskeletal: No deformities Neuro: no focal signs  BP 114/63  Pulse 129  Wt 157 lb (71.215 kg)  EAV:[id number] fibrillation at 105 beats per minute  ASSESSMENT AND PLAN:

## 2011-04-21 ENCOUNTER — Telehealth: Payer: Self-pay | Admitting: Cardiology

## 2011-04-21 LAB — BASIC METABOLIC PANEL
BUN: 45 mg/dL — ABNORMAL HIGH (ref 6–23)
CO2: 28 mEq/L (ref 19–32)
Chloride: 106 mEq/L (ref 96–112)
Potassium: 4.9 mEq/L (ref 3.5–5.3)
Sodium: 140 mEq/L (ref 135–145)

## 2011-04-21 LAB — BRAIN NATRIURETIC PEPTIDE: Brain Natriuretic Peptide: 932.2 pg/mL — ABNORMAL HIGH (ref 0.0–100.0)

## 2011-04-21 NOTE — Telephone Encounter (Signed)
Pt is having SOB, chest congestion. Pt was in OV on 04-20-11 w / Milas Gain. Daughter Aram Beecham would like a return call back to discuss what would pt have for  Congestion OTC, and to give update on pt status.

## 2011-04-21 NOTE — Telephone Encounter (Signed)
Per pt's daughter pt has nasal and chest congestion Drainage and a tendency to develop pneumonia OTC mucinex recommended and plenty of fluids  Pt to be re evaluated on Monday by Dr. Dietrich Pates

## 2011-04-25 ENCOUNTER — Ambulatory Visit (INDEPENDENT_AMBULATORY_CARE_PROVIDER_SITE_OTHER): Payer: Medicare Other | Admitting: Cardiology

## 2011-04-25 ENCOUNTER — Encounter: Payer: Self-pay | Admitting: Cardiology

## 2011-04-25 ENCOUNTER — Encounter: Payer: Self-pay | Admitting: *Deleted

## 2011-04-25 VITALS — BP 106/70 | HR 89 | Ht 67.0 in | Wt 155.0 lb

## 2011-04-25 DIAGNOSIS — Z7901 Long term (current) use of anticoagulants: Secondary | ICD-10-CM

## 2011-04-25 DIAGNOSIS — I4891 Unspecified atrial fibrillation: Secondary | ICD-10-CM

## 2011-04-25 DIAGNOSIS — N189 Chronic kidney disease, unspecified: Secondary | ICD-10-CM

## 2011-04-25 DIAGNOSIS — I350 Nonrheumatic aortic (valve) stenosis: Secondary | ICD-10-CM

## 2011-04-25 DIAGNOSIS — I359 Nonrheumatic aortic valve disorder, unspecified: Secondary | ICD-10-CM

## 2011-04-25 DIAGNOSIS — I714 Abdominal aortic aneurysm, without rupture: Secondary | ICD-10-CM

## 2011-04-25 MED ORDER — NEBIVOLOL HCL 10 MG PO TABS: 15.0000 mg | ORAL_TABLET | Freq: Every day | ORAL | Status: DC

## 2011-04-25 MED ORDER — FUROSEMIDE 40 MG PO TABS: 40.0000 mg | ORAL_TABLET | Freq: Every day | ORAL | Status: DC

## 2011-04-25 NOTE — Patient Instructions (Signed)
Your physician recommends that you schedule a follow-up appointment in: 3 weeks Your physician has recommended you make the following change in your medication: stop aspirin, increase furosemide to 40mg  daily, increase bystolic to 1 1/2 tablet by mouth daily Low salt diet-handout Leg elevation- while out of bed

## 2011-04-26 ENCOUNTER — Encounter: Payer: Self-pay | Admitting: Cardiology

## 2011-04-26 ENCOUNTER — Encounter: Payer: Self-pay | Admitting: *Deleted

## 2011-04-26 DIAGNOSIS — I350 Nonrheumatic aortic (valve) stenosis: Secondary | ICD-10-CM | POA: Insufficient documentation

## 2011-04-26 DIAGNOSIS — I714 Abdominal aortic aneurysm, without rupture: Secondary | ICD-10-CM | POA: Insufficient documentation

## 2011-04-26 DIAGNOSIS — Z7901 Long term (current) use of anticoagulants: Secondary | ICD-10-CM | POA: Insufficient documentation

## 2011-04-26 NOTE — Assessment & Plan Note (Signed)
Patient's renal function is borderline for use of full dose dabigatran.  This will be monitored closely and coagulation studies obtained.

## 2011-04-26 NOTE — Assessment & Plan Note (Addendum)
Etiology of patient's dyspnea and exercise intolerance could be multifactorial or be caused principally by one of his multiple medical issues.  He has left ventricular hypertrophy that could result in diastolic dysfunction.  Coronary angiography has not been repeated since bypass surgery 20 years ago; progression of disease is certainly possible.  He has moderate aortic stenosis, which could be contributing to his symptoms.  He has no definite congestive heart failure, but subclinical CHF could be present as well.  A detailed discussion was held with the patient and his son regarding Troy Hinton wishes for his care.  He prefers a medical approach to his issues, but would be willing to consider other interventions, such as TAVI.  He does not consider his current quality of life to be ideal, but it is acceptable.  He is eager to try any reasonable course the may improve his exercise capacity.  His diuretic dose will be increased and a low salt diet and leg elevation emphasized.  I will see him again in 3 weeks after I have had the opportunity to review his echocardiogram and other testing.  [Echocardiogram reviewed.  Atrial fibrillation was present throughout.  Moderate left atrial enlargement was present as well as mild enlargement of the right atrium.  There is moderate calcification and thickening of the aortic valve with mild insufficiency and moderate stenosis.  Trivial mitral and aortic regurgitation were present.  The left ventricle showed mild to moderate LVH with normal to slightly increased systolic function.  The IVC was dilated.  There was a mobile linear structure attached to the septum of uncertain significance.]  I agree that trials of appropriate medical therapy are a reasonable approach at this time.  A chest x-ray will be obtained to further exclude CHF-CT scanning of the chest in January showed only atelectasis.

## 2011-04-26 NOTE — Progress Notes (Signed)
HPI : [name] returns to the office after recent evaluation by my colleague, Herma Carson.  Atrial fibrillation was diagnosed during a hospitalization in January.  Patient reports months of exertional dyspnea that has not been dramatically progressive.  He currently is able to walk approximately 100 feet on flat ground.  He has no orthopnea nor PND.  He has had substantial pedal edema that has not improved much with initial diuretic therapy.  A recent BNP level was elevated, but not dramatically.  Current Outpatient Prescriptions on File Prior to Visit  Medication Sig Dispense Refill  . dabigatran (PRADAXA) 150 MG CAPS Take 150 mg by mouth every 12 (twelve) hours.        Marland Kitchen diltiazem (CARDIZEM CD) 240 MG 24 hr capsule Take 240 mg by mouth daily.        Marland Kitchen HYDROcodone-acetaminophen (VICODIN) 5-500 MG per tablet Take 1 tablet by mouth every 6 (six) hours as needed.        . Multiple Vitamins-Minerals (MULTIVITAMIN WITH MINERALS) tablet Take 1 tablet by mouth daily.        . nitroGLYCERIN (NITROSTAT) 0.4 MG SL tablet Place 0.4 mg under the tongue every 5 (five) minutes as needed.        . Omega-3 Fatty Acids (FISH OIL) 300 MG CAPS Take 1 capsule by mouth daily.        . potassium chloride (KLOR-CON) 10 MEQ CR tablet Take 1 tablet (10 mEq total) by mouth daily.  30 tablet  3  . simvastatin (ZOCOR) 20 MG tablet Take 20 mg by mouth at bedtime.        . Tamsulosin HCl (FLOMAX) 0.4 MG CAPS Take 0.4 mg by mouth daily.        Marland Kitchen zolpidem (AMBIEN) 10 MG tablet Take 10 mg by mouth at bedtime as needed.        . furosemide (LASIX) 40 MG tablet Take 1 tablet (40 mg total) by mouth daily.  30 tablet  6  . nebivolol (BYSTOLIC) 10 MG tablet Take 1.5 tablets (15 mg total) by mouth daily.  45 tablet  6     No Known Allergies    Past medical history, social history, and family history reviewed and updated.  ROS: See history of present illness.  PHYSICAL EXAM: BP 106/70  Pulse 89  Ht 5\' 7"  (1.702 m)  Wt 155  lb (70.308 kg)  BMI 24.28 kg/m2  SpO2 92%  General-Well developed; no acute distress; alert and mentally sharp Body habitus-proportionate weight and height Neck-No JVD; no carotid bruits; slightly diminished carotid upstrokes Lungs-clear lung fields; resonant to percussion Cardiovascular-normal PMI; normal S1; absent A2; grade 3/6 mid-peaking systolic ejection murmur at the cardiac base with minimal radiation to the carotids Abdomen-normal bowel sounds; soft and non-tender without masses or organomegaly Musculoskeletal-No deformities, no cyanosis or clubbing Neurologic-Normal cranial nerves; symmetric strength and tone Skin-Warm, no significant lesions Extremities-distal pulses intact; 2+ edema to the knees  ASSESSMENT AND PLAN:

## 2011-04-26 NOTE — Assessment & Plan Note (Addendum)
Recent abdominal ultrasound reveals a very modest increase in diameter of aneurysm, which is located below the renal artery origins.  I doubt that intervention will ever be required.  A repeat ultrasound study can be obtained in 2 years.

## 2011-04-26 NOTE — Assessment & Plan Note (Addendum)
Heart rate appears to be controlled in atrial fibrillation.  Although his arrhythmia may be contributing to hsymptoms, maintenance of sinus rhythm would likely be difficult and required medical procedures and interventions that would impose a substantial burden on this elderly gentleman.

## 2011-04-27 ENCOUNTER — Encounter: Payer: Self-pay | Admitting: Cardiology

## 2011-05-05 ENCOUNTER — Other Ambulatory Visit: Payer: Self-pay | Admitting: Cardiology

## 2011-05-06 ENCOUNTER — Encounter: Payer: Self-pay | Admitting: Cardiology

## 2011-05-06 ENCOUNTER — Encounter: Payer: Self-pay | Admitting: *Deleted

## 2011-05-06 DIAGNOSIS — N189 Chronic kidney disease, unspecified: Secondary | ICD-10-CM | POA: Insufficient documentation

## 2011-05-06 LAB — COMPREHENSIVE METABOLIC PANEL
AST: 15 U/L (ref 0–37)
BUN: 49 mg/dL — ABNORMAL HIGH (ref 6–23)
Total Protein: 6.1 g/dL (ref 6.0–8.3)

## 2011-05-06 LAB — PROTIME-INR: Prothrombin Time: 18.9 seconds — ABNORMAL HIGH (ref 11.6–15.2)

## 2011-05-06 LAB — CBC WITH DIFFERENTIAL/PLATELET
Basophils Relative: 0 % (ref 0–1)
Eosinophils Relative: 0 % (ref 0–5)
MCH: 32.8 pg (ref 26.0–34.0)
MCHC: 32.9 g/dL (ref 30.0–36.0)
MCV: 99.8 fL (ref 78.0–100.0)
Neutrophils Relative %: 79 % — ABNORMAL HIGH (ref 43–77)
RDW: 15.8 % — ABNORMAL HIGH (ref 11.5–15.5)

## 2011-05-06 LAB — APTT: aPTT: 44 seconds — ABNORMAL HIGH (ref 24–37)

## 2011-05-09 ENCOUNTER — Telehealth: Payer: Self-pay | Admitting: Cardiology

## 2011-05-09 ENCOUNTER — Encounter: Payer: Self-pay | Admitting: *Deleted

## 2011-05-09 DIAGNOSIS — R799 Abnormal finding of blood chemistry, unspecified: Secondary | ICD-10-CM

## 2011-05-09 MED ORDER — DABIGATRAN ETEXILATE MESYLATE 75 MG PO CAPS: 75.0000 mg | ORAL_CAPSULE | Freq: Two times a day (BID) | ORAL | Status: DC

## 2011-05-09 NOTE — Telephone Encounter (Signed)
Addended by: Augusto Gamble on: 05/09/2011 10:52 AM   Modules accepted: Orders

## 2011-05-09 NOTE — Telephone Encounter (Signed)
Pt daughter called for a  Left shoulder pain and the BP today :104/70.Troy Hinton would like to have a return call from  to discuss the concerns.Troy Hinton can be read by mobile phone.

## 2011-05-10 ENCOUNTER — Telehealth: Payer: Self-pay | Admitting: Cardiology

## 2011-05-10 NOTE — Telephone Encounter (Signed)
Dr. Dietrich Pates had already decreased dose to 75mg  twice daily Labs in 1 month Pt made aware as was his POA

## 2011-05-10 NOTE — Telephone Encounter (Signed)
Patient would like to discuss reducing does of Pradaxa / states that Dr.Rothbart said he was going to do this / tg

## 2011-05-11 ENCOUNTER — Telehealth: Payer: Self-pay | Admitting: Cardiology

## 2011-05-11 NOTE — Telephone Encounter (Signed)
Answered medication questions re: flomax and lasix New medication list printed and let at front desk for Pt's daughter

## 2011-05-11 NOTE — Telephone Encounter (Signed)
Pt daughter, Aram Beecham called to talk with nurse about medication changes. Pt daughter also would like a print out of new medication list.Mr. Aram Beecham will be by office to talk with nurse before 11:00am today.

## 2011-05-12 NOTE — Telephone Encounter (Signed)
Troy Lower, RN 05/10/2011 11:35 AM Signed  Dr. Dietrich Pates had already decreased dose to 75mg  twice daily  Labs in 1 month  Pt made aware as was his POA  Troy Hinton 05/10/2011 9:33 AM Signed  Patient would like to discuss reducing does of Pradaxa / states that Dr.Rothbart said he was going to do this / tg

## 2011-05-15 NOTE — Assessment & Plan Note (Signed)
Continue to monitor her renal function and electrolytes.

## 2011-05-20 ENCOUNTER — Other Ambulatory Visit (HOSPITAL_COMMUNITY): Payer: Self-pay | Admitting: Rheumatology

## 2011-05-20 DIAGNOSIS — M545 Low back pain: Secondary | ICD-10-CM

## 2011-05-20 DIAGNOSIS — M1990 Unspecified osteoarthritis, unspecified site: Secondary | ICD-10-CM

## 2011-05-23 ENCOUNTER — Encounter: Payer: Self-pay | Admitting: Cardiology

## 2011-05-23 ENCOUNTER — Telehealth: Payer: Self-pay | Admitting: *Deleted

## 2011-05-23 NOTE — Telephone Encounter (Signed)
Not mention of cardiac mri in last office note

## 2011-05-24 ENCOUNTER — Encounter: Payer: Self-pay | Admitting: Cardiology

## 2011-05-24 ENCOUNTER — Other Ambulatory Visit (HOSPITAL_COMMUNITY): Payer: Medicare Other

## 2011-05-24 ENCOUNTER — Encounter: Payer: Self-pay | Admitting: *Deleted

## 2011-05-24 ENCOUNTER — Ambulatory Visit (HOSPITAL_COMMUNITY)
Admission: RE | Admit: 2011-05-24 | Discharge: 2011-05-24 | Disposition: A | Discharge status: Home / Self Care | Payer: Medicare Other | Source: Ambulatory Visit | Attending: Rheumatology | Admitting: Rheumatology

## 2011-05-24 ENCOUNTER — Ambulatory Visit (INDEPENDENT_AMBULATORY_CARE_PROVIDER_SITE_OTHER): Payer: Medicare Other | Admitting: Cardiology

## 2011-05-24 ENCOUNTER — Other Ambulatory Visit (HOSPITAL_COMMUNITY): Payer: Self-pay | Admitting: Rheumatology

## 2011-05-24 ENCOUNTER — Telehealth: Payer: Self-pay | Admitting: Cardiology

## 2011-05-24 VITALS — BP 88/64 | HR 86 | Ht 65.0 in | Wt 146.0 lb

## 2011-05-24 DIAGNOSIS — Z79899 Other long term (current) drug therapy: Secondary | ICD-10-CM

## 2011-05-24 DIAGNOSIS — I350 Nonrheumatic aortic (valve) stenosis: Secondary | ICD-10-CM

## 2011-05-24 DIAGNOSIS — M4120 Other idiopathic scoliosis, site unspecified: Secondary | ICD-10-CM | POA: Insufficient documentation

## 2011-05-24 DIAGNOSIS — M479 Spondylosis, unspecified: Secondary | ICD-10-CM

## 2011-05-24 DIAGNOSIS — I359 Nonrheumatic aortic valve disorder, unspecified: Secondary | ICD-10-CM

## 2011-05-24 DIAGNOSIS — I4891 Unspecified atrial fibrillation: Secondary | ICD-10-CM

## 2011-05-24 DIAGNOSIS — M5137 Other intervertebral disc degeneration, lumbosacral region: Secondary | ICD-10-CM | POA: Insufficient documentation

## 2011-05-24 DIAGNOSIS — M1990 Unspecified osteoarthritis, unspecified site: Secondary | ICD-10-CM

## 2011-05-24 DIAGNOSIS — M545 Low back pain, unspecified: Secondary | ICD-10-CM | POA: Insufficient documentation

## 2011-05-24 DIAGNOSIS — F1910 Other psychoactive substance abuse, uncomplicated: Secondary | ICD-10-CM

## 2011-05-24 DIAGNOSIS — M5126 Other intervertebral disc displacement, lumbar region: Secondary | ICD-10-CM | POA: Insufficient documentation

## 2011-05-24 DIAGNOSIS — M51379 Other intervertebral disc degeneration, lumbosacral region without mention of lumbar back pain or lower extremity pain: Secondary | ICD-10-CM | POA: Insufficient documentation

## 2011-05-24 MED ORDER — FUROSEMIDE 40 MG PO TABS: 60.0000 mg | ORAL_TABLET | Freq: Every day | ORAL | Status: DC

## 2011-05-24 MED ORDER — DIGOXIN 125 MCG PO TABS: 125.0000 ug | ORAL_TABLET | Freq: Every day | ORAL | Status: DC

## 2011-05-24 MED ORDER — ZOLPIDEM TARTRATE 5 MG PO TABS: 2.5000 mg | ORAL_TABLET | Freq: Every evening | ORAL | Status: DC | PRN

## 2011-05-24 NOTE — Patient Instructions (Signed)
Your physician recommends that you schedule a follow-up appointment in: 1 month Your physician has recommended you make the following change in your medication: discontinue flow max, simvastatin, discontinue bystolic in 5 days on 05/29/2011, discontinue potassium, digoxin 0.125mg  daily, increase lasix to 60mg  daily, taper ambien to 2.5mg  twice a nite as needed Your physician has requested that you regularly monitor and record your blood pressure readings at home. Please use the same machine at the same time of day to check your readings and record them to bring to your follow-up visit. And record   Your physician recommends that you return for lab work in: 1 month

## 2011-05-25 ENCOUNTER — Other Ambulatory Visit: Payer: Self-pay | Admitting: Cardiology

## 2011-05-25 ENCOUNTER — Ambulatory Visit (HOSPITAL_COMMUNITY)
Admission: RE | Admit: 2011-05-25 | Discharge: 2011-05-25 | Disposition: A | Discharge status: Home / Self Care | Payer: Medicare Other | Source: Ambulatory Visit | Attending: Cardiology | Admitting: Cardiology

## 2011-05-25 DIAGNOSIS — Z951 Presence of aortocoronary bypass graft: Secondary | ICD-10-CM | POA: Insufficient documentation

## 2011-05-25 DIAGNOSIS — I10 Essential (primary) hypertension: Secondary | ICD-10-CM | POA: Insufficient documentation

## 2011-05-25 DIAGNOSIS — R0602 Shortness of breath: Secondary | ICD-10-CM

## 2011-05-25 NOTE — Telephone Encounter (Signed)
Answered  Medication questions for pt's daughter

## 2011-05-27 ENCOUNTER — Encounter: Payer: Self-pay | Admitting: Cardiology

## 2011-05-27 NOTE — Assessment & Plan Note (Addendum)
Valvular disease and subclinical congestive heart failure may be contributing to patient's symptoms.  Since he is improved with furosemide, dosage will be increased to 60 mg q.d.  We will continue to monitor electrolytes and renal function and to follow Mr. Avey closely, planning a return visit in one month.

## 2011-05-27 NOTE — Assessment & Plan Note (Addendum)
Heart rate in atrial fibrillation is under good control; however, relative hypotension is present.  Beta blocker may be contributing to his symptoms as well as hypotension; digoxin will be substituted at a dose appropriate to patient's renal function.  Flomax will also be discontinued, as it may be further lowering blood pressure.  Patient denies any serious symptoms of prostatism in the past.

## 2011-05-27 NOTE — Progress Notes (Signed)
HPI : [name] returns to the office for continuing assessment and treatment of atrial fibrillation.  Patient is markedly improved symptomatically with reduced dyspnea on exertion .  Furosemide and beta blocker has been added to his medical regime, but he discontinued daily aspirin.  Weight loss has been substantial since furosemide was added.  Current Outpatient Prescriptions on File Prior to Visit  Medication Sig Dispense Refill  . dabigatran (PRADAXA) 75 MG CAPS Take 1 capsule (75 mg total) by mouth every 12 (twelve) hours.  60 capsule  3  . digoxin (LANOXIN) 0.125 MG tablet Take 1 tablet (125 mcg total) by mouth daily.  30 tablet  6  . diltiazem (CARDIZEM CD) 240 MG 24 hr capsule Take 240 mg by mouth daily.        . furosemide (LASIX) 40 MG tablet Take 1.5 tablets (60 mg total) by mouth daily.  45 tablet  6  . HYDROcodone-acetaminophen (VICODIN) 5-500 MG per tablet Take 1 tablet by mouth every 6 (six) hours as needed.        . Multiple Vitamins-Minerals (MULTIVITAMIN WITH MINERALS) tablet Take 1 tablet by mouth daily.        . nitroGLYCERIN (NITROSTAT) 0.4 MG SL tablet Place 0.4 mg under the tongue every 5 (five) minutes as needed.        . Omega-3 Fatty Acids (FISH OIL) 300 MG CAPS Take 1 capsule by mouth daily.        Marland Kitchen zolpidem (AMBIEN) 5 MG tablet Take 0.5 tablets (2.5 mg total) by mouth at bedtime as needed (may repeat x1).  30 tablet  1  . DISCONTD: nebivolol (BYSTOLIC) 10 MG tablet Take 1.5 tablets (15 mg total) by mouth daily.  45 tablet  6     No Known Allergies    Past medical history, social history, and family history reviewed and updated.  ROS: Denies chest discomfort, orthopnea, PND or syncope.  PHYSICAL EXAM: BP 88/64  Pulse 86  Ht 5\' 5"  (1.651 m)  Wt 66.225 kg (146 lb)  BMI 24.30 kg/m2  SpO2 92% ; weight has decreased 9 pounds since prior visit General-Well developed; no acute distress Body habitus-proportionate weight and height Neck-No JVD; no carotid  bruits Lungs-clear lung fields; resonant to percussion Cardiovascular-normal PMI; normal S1 and S2; grade 2/6 systolic murmur. Abdomen-normal bowel sounds; soft and non-tender without masses or organomegaly Musculoskeletal-No deformities, no cyanosis or clubbing; substantial scoliosis Neurologic-Normal cranial nerves; symmetric strength and tone Skin-Warm, no significant lesions Extremities-distal pulses intact; no edema  CXR:  Emphysematous parenchymal abnormalities with bibasilar atelectasis but no evidence for congestive heart failure.  Echocardiogram:  11/25/2010-mild to moderate LVH; hyperdynamic systolic function; moderate aortic stenosis; mild AI; elevated right atrial pressure  ASSESSMENT AND PLAN:

## 2011-06-09 ENCOUNTER — Telehealth: Payer: Self-pay

## 2011-06-21 ENCOUNTER — Other Ambulatory Visit: Payer: Self-pay | Admitting: Cardiology

## 2011-06-24 ENCOUNTER — Encounter: Payer: Self-pay | Admitting: *Deleted

## 2011-06-24 ENCOUNTER — Ambulatory Visit: Payer: Medicare Other | Admitting: Cardiology

## 2011-06-27 ENCOUNTER — Ambulatory Visit (INDEPENDENT_AMBULATORY_CARE_PROVIDER_SITE_OTHER): Payer: Medicare Other | Admitting: Cardiology

## 2011-06-27 ENCOUNTER — Encounter: Payer: Self-pay | Admitting: Cardiology

## 2011-06-27 DIAGNOSIS — E162 Hypoglycemia, unspecified: Secondary | ICD-10-CM

## 2011-06-27 DIAGNOSIS — Z7901 Long term (current) use of anticoagulants: Secondary | ICD-10-CM

## 2011-06-27 DIAGNOSIS — I350 Nonrheumatic aortic (valve) stenosis: Secondary | ICD-10-CM

## 2011-06-27 DIAGNOSIS — R7989 Other specified abnormal findings of blood chemistry: Secondary | ICD-10-CM

## 2011-06-27 DIAGNOSIS — F1910 Other psychoactive substance abuse, uncomplicated: Secondary | ICD-10-CM

## 2011-06-27 DIAGNOSIS — I714 Abdominal aortic aneurysm, without rupture, unspecified: Secondary | ICD-10-CM

## 2011-06-27 DIAGNOSIS — Z79899 Other long term (current) drug therapy: Secondary | ICD-10-CM

## 2011-06-27 DIAGNOSIS — N189 Chronic kidney disease, unspecified: Secondary | ICD-10-CM

## 2011-06-27 DIAGNOSIS — I509 Heart failure, unspecified: Secondary | ICD-10-CM

## 2011-06-27 DIAGNOSIS — I4891 Unspecified atrial fibrillation: Secondary | ICD-10-CM

## 2011-06-27 DIAGNOSIS — R609 Edema, unspecified: Secondary | ICD-10-CM

## 2011-06-27 DIAGNOSIS — R799 Abnormal finding of blood chemistry, unspecified: Secondary | ICD-10-CM

## 2011-06-27 NOTE — Progress Notes (Signed)
HPI : [name] returns to the office with his daughter for continued assessment and treatment of aortic valve disease, peripheral edema and chronic kidney disease.  Since his last visit, he has noted decreased energy and decreased desire to do things.  His daughter also perceives a change in her father in regard to his energy.  He denies dyspnea, orthopnea, PND and chest discomfort.  He has had no syncope.  Pedal edema is only modestly improved despite a low salt diet, as much leg elevation as he can manage and a dose of furosemide that has increased serum creatinine.  Current Outpatient Prescriptions on File Prior to Visit  Medication Sig Dispense Refill  . dabigatran (PRADAXA) 75 MG CAPS Take 1 capsule (75 mg total) by mouth every 12 (twelve) hours.  60 capsule  3  . digoxin (LANOXIN) 0.125 MG tablet Take 1 tablet (125 mcg total) by mouth daily.  30 tablet  6  . diltiazem (CARDIZEM CD) 240 MG 24 hr capsule Take 240 mg by mouth daily.        . furosemide (LASIX) 40 MG tablet Take 1.5 tablets (60 mg total) by mouth daily.  45 tablet  6  . HYDROcodone-acetaminophen (VICODIN) 5-500 MG per tablet Take 1 tablet by mouth every 6 (six) hours as needed.        . Multiple Vitamins-Minerals (MULTIVITAMIN WITH MINERALS) tablet Take 1 tablet by mouth daily.        . nitroGLYCERIN (NITROSTAT) 0.4 MG SL tablet Place 0.4 mg under the tongue every 5 (five) minutes as needed.        . Omega-3 Fatty Acids (FISH OIL) 300 MG CAPS Take 1,200 mg by mouth daily.       Marland Kitchen zolpidem (AMBIEN) 5 MG tablet Take 0.5 tablets (2.5 mg total) by mouth at bedtime as needed (may repeat x1).  30 tablet  1  . lidocaine (LIDODERM) 5 % Place 1 patch onto the skin daily. Remove & Discard patch within 12 hours or as directed by MD          No Known Allergies    Past medical history, social history, and family history reviewed and updated.  ROS: See history of present illness  PHYSICAL EXAM: BP 118/70  Pulse 80  Resp 18  Ht 5\' 6"   (1.676 m)  Wt 64.465 kg (142 lb 1.9 oz)  BMI 22.94 kg/m2  SpO2 98%  General-Well developed; no acute distress; lively in spirit and mind Body habitus-proportionate weight and height Neck-Left external jugular is prominent, but no abnormal JVP appreciated; somewhat decreased carotid upstrokes with transmitted murmur bilaterally Lungs-clear lung fields; resonant to percussion Cardiovascular-normal PMI; normal S1 and absent A2; grade 3/6 mid-peaking systolic ejection murmur heard across the precordium; irregular rhythm Abdomen-normal bowel sounds; soft and non-tender without masses or organomegaly Musculoskeletal-No deformities, no cyanosis or clubbing Neurologic-Normal cranial nerves; symmetric strength and tone Skin-Warm, no significant lesions Extremities-distal pulses intact; 2-3+ pretibial edema bilaterally  ASSESSMENT AND PLAN:

## 2011-06-27 NOTE — Patient Instructions (Signed)
Your physician recommends that you schedule a follow-up appointment in: 3 months Your physician recommends that you return for lab work in: today and 6 weeks Low salt diet - handout Leg elevation

## 2011-06-28 ENCOUNTER — Other Ambulatory Visit: Payer: Self-pay

## 2011-06-28 ENCOUNTER — Encounter: Payer: Self-pay | Admitting: Cardiology

## 2011-06-28 DIAGNOSIS — R609 Edema, unspecified: Secondary | ICD-10-CM | POA: Insufficient documentation

## 2011-06-28 LAB — COMPREHENSIVE METABOLIC PANEL
AST: 18 U/L (ref 0–37)
Albumin: 4 g/dL (ref 3.5–5.2)
Alkaline Phosphatase: 70 U/L (ref 39–117)
BUN: 32 mg/dL — ABNORMAL HIGH (ref 6–23)
Calcium: 9.2 mg/dL (ref 8.4–10.5)
Chloride: 101 mEq/L (ref 96–112)
Creat: 1.37 mg/dL — ABNORMAL HIGH (ref 0.50–1.35)
Glucose, Bld: 91 mg/dL (ref 70–99)
Potassium: 4 mEq/L (ref 3.5–5.3)

## 2011-06-28 LAB — HEMOGLOBIN A1C
Hgb A1c MFr Bld: 6.3 % — ABNORMAL HIGH (ref <5.7)
Mean Plasma Glucose: 134 mg/dL — ABNORMAL HIGH (ref <117)

## 2011-06-28 LAB — CBC WITH DIFFERENTIAL/PLATELET
HCT: 41.3 % (ref 39.0–52.0)
Hemoglobin: 13.5 g/dL (ref 13.0–17.0)
Lymphs Abs: 2.5 10*3/uL (ref 0.7–4.0)
Monocytes Absolute: 0.9 10*3/uL (ref 0.1–1.0)
Monocytes Relative: 14 % — ABNORMAL HIGH (ref 3–12)
Neutro Abs: 3.1 10*3/uL (ref 1.7–7.7)
Neutrophils Relative %: 47 % (ref 43–77)
RBC: 4.02 MIL/uL — ABNORMAL LOW (ref 4.22–5.81)

## 2011-06-28 NOTE — Assessment & Plan Note (Addendum)
Aneurysm is small and has not been progressive.  Considering Troy Hinton advanced age, the likelihood that this will ever be a significant problem is minimal.  He asked whether he needs continuing contact with the vascular surgeons.  I told him that I would be happy to manage this problem for him.

## 2011-06-28 NOTE — Assessment & Plan Note (Signed)
Atrial fibrillation, likewise, might also be contributing to his symptoms.  The likelihood of maintaining sinus rhythm for any reasonable interval is small, and I do not recommend cardioversion at present.

## 2011-06-28 NOTE — Assessment & Plan Note (Signed)
Renal dysfunction is relatively mild considering the patient's age, but appears to be progressive in recent weeks.  Continued monitoring is appropriate.  I will plan to reassess this nice gentleman in 3 months.

## 2011-06-28 NOTE — Assessment & Plan Note (Signed)
He is doing well on dabigatran, which will be continued.  PTT was 44, suggesting a significant drug effect.

## 2011-06-28 NOTE — Assessment & Plan Note (Signed)
The contribution of aortic stenosis to his general fatigue and exercise intolerance is difficult to determine, but is likely significant.  I discussed at length with the patient and his daughter the options for treatment and the triggers that would cause one to consider intervention, either percutaneously or surgically.  As a redo open heart surgery at age 77, he is a very suboptimal candidate.  I do not believe that he is symptomatic enough at present to justify percutaneous intervention, but that remains an option for the future.

## 2011-07-07 ENCOUNTER — Other Ambulatory Visit: Payer: Self-pay

## 2011-09-14 ENCOUNTER — Other Ambulatory Visit: Payer: Self-pay | Admitting: Cardiology

## 2011-09-26 ENCOUNTER — Encounter: Payer: Self-pay | Admitting: Cardiology

## 2011-09-27 ENCOUNTER — Other Ambulatory Visit: Payer: Self-pay | Admitting: Cardiology

## 2011-09-27 ENCOUNTER — Ambulatory Visit (INDEPENDENT_AMBULATORY_CARE_PROVIDER_SITE_OTHER): Payer: Medicare Other | Admitting: Cardiology

## 2011-09-27 ENCOUNTER — Encounter: Payer: Self-pay | Admitting: Cardiology

## 2011-09-27 DIAGNOSIS — I714 Abdominal aortic aneurysm, without rupture: Secondary | ICD-10-CM

## 2011-09-27 DIAGNOSIS — N189 Chronic kidney disease, unspecified: Secondary | ICD-10-CM

## 2011-09-27 DIAGNOSIS — I359 Nonrheumatic aortic valve disorder, unspecified: Secondary | ICD-10-CM

## 2011-09-27 DIAGNOSIS — I350 Nonrheumatic aortic (valve) stenosis: Secondary | ICD-10-CM

## 2011-09-27 DIAGNOSIS — R609 Edema, unspecified: Secondary | ICD-10-CM

## 2011-09-27 DIAGNOSIS — I4891 Unspecified atrial fibrillation: Secondary | ICD-10-CM

## 2011-09-27 DIAGNOSIS — Z7901 Long term (current) use of anticoagulants: Secondary | ICD-10-CM

## 2011-09-27 MED ORDER — VERAPAMIL HCL 240 MG (CO) PO TB24: 240.0000 mg | ORAL_TABLET | Freq: Every day | ORAL | Status: DC

## 2011-09-27 MED ORDER — FUROSEMIDE 80 MG PO TABS: 80.0000 mg | ORAL_TABLET | Freq: Two times a day (BID) | ORAL | Status: DC

## 2011-09-27 MED ORDER — FUROSEMIDE 80 MG PO TABS: 80.0000 mg | ORAL_TABLET | Freq: Every day | ORAL | Status: DC

## 2011-09-27 NOTE — Assessment & Plan Note (Signed)
Moderate renal dysfunction with adequate GFR to allow use of dabigatran.

## 2011-09-27 NOTE — Patient Instructions (Addendum)
Your physician recommends that you schedule a follow-up appointment in: 2 months with Dr Dietrich Pates  Your physician has recommended you make the following change in your medication:   1 - Increase Lasix to 80 mg daily 2 - STOP Diltiazem 3 - Start Verapamil SR 240 mg daily  Your physician recommends that you return for lab work in: Today and in 6 weeks  Stool cards x 3 for blood and return to office as soon as complete  Abdominal Ultrasound in January for evaluation of Aneurysm

## 2011-09-27 NOTE — Assessment & Plan Note (Signed)
Partial thromboplastin time is mildly elevated suggesting an adequate drug effect.  CBCs and stool for Hemoccult testing will be monitored to exclude occult GI blood loss.

## 2011-09-27 NOTE — Assessment & Plan Note (Signed)
Aneurysm remains small, nonpalpable and non-symptomatic.  Repeat imaging will be performed in January.  If there is no change in size, subsequent imaging in 2 years would be reasonable.

## 2011-09-27 NOTE — Assessment & Plan Note (Signed)
Aortic stenosis is substantial, but not resulting in symptoms.  For now, continued medical therapy and clinical follow-up is all that is necessary.

## 2011-09-27 NOTE — Assessment & Plan Note (Signed)
Pedal edema remains substantial with little apparent benefit from moderate dose furosemide.  Verapamil will be substituted for diltiazem in an attempt to decrease edema.  Patient has also complained of anorexia, which could be related to diltiazem.  Dose of furosemide will be increased to 80 mg per day, but I am disinclined to treat with high-dose diuretic, since pedal edemas not causing any significant health problems.

## 2011-09-27 NOTE — Assessment & Plan Note (Signed)
No symptoms related to arrhythmia.  Full anticoagulation with dabigatran providing protection against cardioembolic events.

## 2011-09-27 NOTE — Progress Notes (Signed)
HPI: Mr. Mejorado returns to the office as scheduled for continued assessment and treatment of aortic valve disease, peripheral edema and chronic kidney disease.  Since his last visit, he has done fairly well.  His energy level has recovered spontaneously, and he denies dyspnea or chest discomfort.  Edema has persisted, but is not particularly bother him.  His daughter indicates that he is fairly well behaved with respect to salt restriction and leg elevation.   Prior to Admission medications   Medication Sig Start Date End Date Taking? Authorizing Provider  digoxin (LANOXIN) 0.125 MG tablet Take 1 tablet (125 mcg total) by mouth daily. 05/24/11  Yes Gerrit Friends. Mikaila Grunert, MD  furosemide (LASIX) 80 MG tablet Take 1 tablet (80 mg total) by mouth daily. 09/27/11 09/26/12 Yes Gerrit Friends. Colbie Danner, MD  HYDROcodone-acetaminophen (VICODIN) 5-500 MG per tablet Take 1 tablet by mouth every 6 (six) hours as needed.     Yes Historical Provider, MD  Multiple Vitamins-Minerals (MULTIVITAMIN WITH MINERALS) tablet Take 1 tablet by mouth daily.     Yes Historical Provider, MD  nitroGLYCERIN (NITROSTAT) 0.4 MG SL tablet Place 0.4 mg under the tongue every 5 (five) minutes as needed.     Yes Historical Provider, MD  Omega-3 Fatty Acids (FISH OIL) 300 MG CAPS Take 1,200 mg by mouth daily.    Yes Historical Provider, MD  PRADAXA 75 MG CAPS TAKE 1 CAPSULE BY MOUTH EVERY 12 HOURS. 09/14/11  Yes Gerrit Friends. Lamira Borin, MD  verapamil (COVERA HS) 240 MG (CO) 24 hr tablet Take 1 tablet (240 mg total) by mouth at bedtime. 09/27/11 09/26/12 Yes Gerrit Friends. Dietrich Pates, MD  zolpidem (AMBIEN) 5 MG tablet Take 0.5 tablets (2.5 mg total) by mouth at bedtime as needed (may repeat x1). 05/24/11  Yes Gerrit Friends. Dietrich Pates, MD    No Known Allergies    Past medical history, social history, and family history reviewed and updated.  ROS: Denies orthopnea, PND, lightheadedness, palpitations, syncope, cough or sputum production.  PHYSICAL EXAM: BP 116/67   Pulse 66  Ht 5\' 10"  (1.778 m)  Wt 68.947 kg (152 lb)  BMI 21.81 kg/m2   General-Well developed; no acute distress Body habitus-proportionate weight and height Neck-No JVD; transmitted murmur; mildly decreased carotid upstrokes Lungs-clear lung fields; resonant to percussion Cardiovascular-normal PMI; normal S1 and decreased S2; grade 3/6 mid-peaking systolic ejection murmur at the cardiac base Abdomen-normal bowel sounds; soft and non-tender without masses or organomegaly Musculoskeletal-No deformities, no cyanosis or clubbing Neurologic-Normal cranial nerves; symmetric strength and tone Skin-Warm, no significant lesions Extremities-distal pulses intact; 2+ pitting edema to the knees  ASSESSMENT AND PLAN:  [place] Bing, MD 09/27/2011 6:38 PM

## 2011-09-28 ENCOUNTER — Telehealth: Payer: Self-pay | Admitting: *Deleted

## 2011-09-28 ENCOUNTER — Encounter: Payer: Self-pay | Admitting: *Deleted

## 2011-09-28 LAB — BASIC METABOLIC PANEL
CO2: 28 mEq/L (ref 19–32)
Chloride: 102 mEq/L (ref 96–112)
Creat: 1.36 mg/dL — ABNORMAL HIGH (ref 0.50–1.35)
Potassium: 4.2 mEq/L (ref 3.5–5.3)
Sodium: 143 mEq/L (ref 135–145)

## 2011-09-28 NOTE — Telephone Encounter (Signed)
Results called to patient.

## 2011-10-11 ENCOUNTER — Ambulatory Visit (INDEPENDENT_AMBULATORY_CARE_PROVIDER_SITE_OTHER): Payer: Medicare Other

## 2011-10-11 DIAGNOSIS — Z7901 Long term (current) use of anticoagulants: Secondary | ICD-10-CM

## 2011-11-01 ENCOUNTER — Encounter (INDEPENDENT_AMBULATORY_CARE_PROVIDER_SITE_OTHER): Payer: Self-pay | Admitting: Internal Medicine

## 2011-11-01 ENCOUNTER — Ambulatory Visit (INDEPENDENT_AMBULATORY_CARE_PROVIDER_SITE_OTHER): Payer: Medicare Other | Admitting: Internal Medicine

## 2011-11-01 VITALS — BP 110/68 | HR 74 | Temp 97.7°F | Resp 14 | Ht 71.0 in | Wt 151.0 lb

## 2011-11-01 DIAGNOSIS — R634 Abnormal weight loss: Secondary | ICD-10-CM

## 2011-11-01 DIAGNOSIS — R1013 Epigastric pain: Secondary | ICD-10-CM

## 2011-11-01 DIAGNOSIS — K5900 Constipation, unspecified: Secondary | ICD-10-CM | POA: Insufficient documentation

## 2011-11-01 DIAGNOSIS — R630 Anorexia: Secondary | ICD-10-CM | POA: Insufficient documentation

## 2011-11-01 MED ORDER — PANTOPRAZOLE SODIUM 40 MG PO TBEC: 40.0000 mg | DELAYED_RELEASE_TABLET | Freq: Every day | ORAL | Status: DC

## 2011-11-01 NOTE — Consult Note (Signed)
NAME:  Troy Hinton, Troy Hinton NO.:  [id number]  MEDICAL RECORD NO.:  [id number]  LOCATION:                                 FACILITY:  PHYSICIAN:  Troy Hinton, M.D.    DATE OF BIRTH:  May 08, 1917  DATE OF CONSULTATION:  11/01/2011 DATE OF DISCHARGE:                                CONSULTATION   REASON FOR CONSULTATION:  Epigastric pain, weight loss, and constipation.  HISTORY OF PRESENT ILLNESS:  Troy Hinton is 77 year old Caucasian male who is referred through courtesy of Dr. Juanetta Hinton for GI evaluation.  He is accompanied by his daughter, Ms. Troy Hinton, today.  He presents with few months history of epigastric pain.  Lately, he has been experiencing pain every day.  Pain is usually worse after breakfast.  He has not noted any relief with the pain medication that he takes for his back pain.  He has had 2 episodes of emesis, but no hematemesis reported.  He also has noted drop in his appetite.  He lost 10 pounds, but his daughter states that he has gained some of it back. He describes his pain as dull, knowing pain which is never intractable or unbearable.  He also describes this as an abnormal sensation.  He has had few episodes of regurgitation and he denies frequent heartburn.  He denies melena or rectal bleeding, but lately he has had intermittent constipation which he is trying to treat with prune juice, as well as dietary measures.  He did have 3 Hemoccults through Dr. Marvel Plan office and all of these were negative.  He has history of AAA and he is scheduled to have followup ultrasound in about 2 weeks.  He denies dysuria or hematuria.  REVIEW OF SYSTEMS:  Positive for chronic low back pain.  He also denies dyspnea or chronic cough.  When he was asked as to the location of his pain, he points to upper epigastric and lower retrosternal area.  The patient states that he has undergone multiple colonoscopies and these were normal.  The last one was perhaps over 10  years ago.  He has been on Pradaxa for 9-10 months and he was diagnosed with atrial fibrillation.  CURRENT MEDICATIONS: 1. Lanoxin 125 mcg p.o. daily. 2. Furosemide 80 mg p.o. daily. 3. Vicodin 5/500 one tablet every 6 hours as needed for back pain. 4. MVI with minerals 1 daily. 5. Nitrostat 0.4 mg sublingual p.r.n. chest pain. 6. Omega-3 fatty acids 1.2 g p.o. daily. 7. Pradaxa 75 mg p.o. b.i.d. 8. Verapamil 240 mg p.o. q.24 hours. 9. Zolpidem 2.5 mg at bedtime p.r.n.  PAST MEDICAL HISTORY:  He has chronic low back pain secondary to arthritis. He has aortic stenosis.  Atrial fibrillation was diagnosed in January of this year.  He has AAA, last ultrasound was in January 2012, and it measured 3.6 x 3.4 cm.  He is due for followup ultrasound in few weeks. Coronary artery disease status post CABG over 20 years ago.  He has had right inguinal herniorrhaphy x2.  He had cholecystectomy several years ago.  He has had bilateral cataract surgery.  He had surgery on his right for dislocated lens several years ago.  He also had bilateral eye surgery for drooping lids or ptosis.  Chronic kidney disease, creatinine was 1.79 six months ago with 4 months ago was 1.37. He had chronic lower extremity edema.  ALLERGIES:  NK.  FAMILY HISTORY:  Father had Parkinson disease and died following surgery for AAA at age 63.  He had a brother with Parkinson's disease, but diseased for about 10 years and died at 56.  SOCIAL HISTORY:  He is widowed.  He just lost his wife about 3 months ago.  He has 2 children.  His daughter is with him today.  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  He never smoked cigarettes or drank alcohol.  PHYSICAL EXAMINATION:  GENERAL:  Pleasant, well-developed thin Caucasian male, who is in no acute distress. VITAL SIGNS:  He weighs 151 pounds, he is 71 inches tall, pulse 74 per minute, blood pressure 110/68, respirations 14, and temp is  97.7. HEENT:  Conjunctivae are pink.  Sclerae are nonicteric.  Oropharyngeal mucosa is normal.  No neck masses or thyromegaly noted. CARDIAC:  With irregular rhythm.  Normal S1 and S2.  Grade 2/6 systolic ejection murmur best heard at aortic area. LUNGS:  Clear to auscultation. ABDOMEN:  Symmetrical.  Bowel sounds are normal.  On palpation, soft abdomen with easily palpable sigmoid colon with mild stool.  No organomegaly or masses.  No tenderness noted. RECTAL:  Deferred. EXTREMITIES:  He has 2 to 3+ pitting edema involving both his lower extremities.  LABORATORY DATA:  From June 27, 2011, WBC 6.7, H and H 13.5 and 41.3, MCV is 102.7.  His electrolytes were normal.  BUN 32, creatinine 1.37, bilirubin was 0.7, AP 70, AST 18, ALT 18, total protein 5.7 with albumin of 4.0, and calcium of 9.2.  ASSESSMENT:  Troy Hinton is a very pleasant 77 year old Caucasian male who presents with few month history of gnawwing dull, epigastric/lower sternal pain, at times, worse with breakfast, who also has noted some drop in his appetite.  Recent Hemoccult x3 were negative.  He has had remote cholecystectomy.  His LFTs 4 months ago was normal and so was his H and H.  The patient has been on Pradaxa which was started earlier this year for new onset of atrial fibrillation.  The patient has been under lot of stress because of his wife's illness who died about 3 months ago. He is therefore at risk for peptic ulcer disease, and/or GI side effects with Pradaxa.  He also appears to be constipated, but I doubt that his constipation is the source of his symptoms.  RECOMMENDATIONS: 1. We will start him on pantoprazole 40 mg p.o. q.a.m. prescription     given for 30 with 5 refills.  We will arrange for him to have a     complete abdominal ultrasound next month rather than just limited     ultrasound. 2. Continue with high-fiber diet and prune juice. 3. Colace 2 tablets daily at bedtime. 4. If ultrasonography  is negative for pancreatobiliary tract disease,     may consider diagnostic EGD, particularly if symptoms do not     respond to PPI.  We appreciate the opportunity to participate in the care of this gentleman.          ______________________________ Troy Hinton, M.D.     NR/MEDQ  D:  11/01/2011  T:  11/01/2011  Job:  [id number]  cc:   Dr. Juanetta Hinton

## 2011-11-01 NOTE — Consult Note (Deleted)
Note dictated; Job [id number]

## 2011-11-01 NOTE — Patient Instructions (Signed)
Known medication is pantoprazole; take 40 mg daily 30 minutes before breakfast. Colace 2 tablets by mouth daily at bedtime. Ultrasound to be scheduled to examine pancreas bile duct and liver.

## 2011-11-04 NOTE — Progress Notes (Signed)
REASON FOR CONSULTATION: Epigastric pain, weight loss, and  constipation.  HISTORY OF PRESENT ILLNESS: Troy Hinton is 77 year old Caucasian male who is  referred through courtesy of Dr. Juanetta Hinton for GI evaluation. He is  accompanied by his daughter, Ms. Troy Hinton, today.  He presents with few months history of epigastric pain. Lately, he has  been experiencing pain every day. Pain is usually worse after  breakfast. He has not noted any relief with the pain medication that he  takes for his back pain. He has had 2 episodes of emesis, but no  hematemesis reported. He also has noted drop in his appetite. He lost  10 pounds, but his daughter states that he has gained some of it back.  He describes his pain as dull, knowing pain which is never intractable  or unbearable. He also describes this as an abnormal sensation. He has  had few episodes of regurgitation and he denies frequent heartburn. He  denies melena or rectal bleeding, but lately he has had intermittent  constipation which he is trying to treat with prune juice, as well as  dietary measures. He did have 3 Hemoccults through Dr. Marvel Hinton  office and all of these were negative. He has history of AAA and he is  scheduled to have followup ultrasound in about 2 weeks. He denies  dysuria or hematuria.  REVIEW OF SYSTEMS: Positive for chronic low back pain. He also denies  dyspnea or chronic cough. When he was asked as to the location of his  pain, he points to upper epigastric and lower retrosternal area. The  patient states that he has undergone multiple colonoscopies and these  were normal. The last one was perhaps over 10 years ago. He has been  on Pradaxa for 9-10 months and he was diagnosed with atrial  fibrillation.  CURRENT MEDICATIONS:  1. Lanoxin 125 mcg p.o. daily.  2. Furosemide 80 mg p.o. daily.  3. Vicodin 5/500 one tablet every 6 hours as needed for back pain.  4. MVI with minerals 1 daily.  5. Nitrostat 0.4 mg  sublingual p.r.n. chest pain.  6. Omega-3 fatty acids 1.2 g p.o. daily.  7. Pradaxa 75 mg p.o. b.i.d.  8. Verapamil 240 mg p.o. q.24 hours.  9. Zolpidem 2.5 mg at bedtime p.r.n.  PAST MEDICAL HISTORY: He has chronic low back pain secondary to  arthritis.  He has aortic stenosis. Atrial fibrillation was diagnosed in January of  this year. He has AAA, last ultrasound was in January 2012, and it  measured 3.6 x 3.4 cm. He is due for followup ultrasound in few weeks.  Coronary artery disease status post CABG over 20 years ago. He has had  right inguinal herniorrhaphy x2. He had cholecystectomy several years  ago. He has had bilateral cataract surgery. He had surgery on his  right for dislocated lens several years ago. He also had bilateral eye  surgery for drooping lids or ptosis. Chronic kidney disease, creatinine  was 1.79 six months ago with 4 months ago was 1.37.  He had chronic lower extremity edema.  ALLERGIES: NK.  FAMILY HISTORY: Father had Parkinson disease and died following surgery  for AAA at age 39. He had a brother with Parkinson's disease, but  diseased for about 10 years and died at 75.  SOCIAL HISTORY: He is widowed. He just lost his wife about 3 months  ago. He has 2 children. His daughter is with him today. 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 He  never smoked cigarettes or drank alcohol.  PHYSICAL EXAMINATION: GENERAL: Pleasant, well-developed thin Caucasian  male, who is in no acute distress.  VITAL SIGNS: He weighs 151 pounds, he is 71 inches tall, pulse 74 per  minute, blood pressure 110/68, respirations 14, and temp is 97.7.  HEENT: Conjunctivae are pink. Sclerae are nonicteric. Oropharyngeal  mucosa is normal. No neck masses or thyromegaly noted.  CARDIAC: With irregular rhythm. Normal S1 and S2. Grade 2/6 systolic  ejection murmur best heard at aortic area.  LUNGS: Clear to auscultation.  ABDOMEN: Symmetrical. Bowel  sounds are normal. On palpation, soft  abdomen with easily palpable sigmoid colon with mild stool. No  organomegaly or masses. No tenderness noted.  RECTAL: Deferred.  EXTREMITIES: He has 2 to 3+ pitting edema involving both his lower  extremities.  LABORATORY DATA: From June 27, 2011, WBC 6.7, H and H 13.5 and 41.3,  MCV is 102.7. His electrolytes were normal. BUN 32, creatinine 1.37,  bilirubin was 0.7, AP 70, AST 18, ALT 18, total protein 5.7 with albumin  of 4.0, and calcium of 9.2.  ASSESSMENT: [name] is a very pleasant 77 year old Caucasian male who  presents with few month history of knowing dull, epigastric/lower  sternal pain, at times, worse with breakfast, who also has noted some  drop in his appetite. Recent Hemoccult x3 were negative. He has had  remote cholecystectomy. His LFTs 4 months ago was normal and so was his  H and H. The patient has been on Pradaxa which was started earlier this  year for new onset of atrial fibrillation. The patient has been under  lot of stress because of his wife's illness who died about 3 months ago.  He is therefore at risk for peptic ulcer disease, and/or GI side effects  with Pradaxa.  He also appears to be constipated, but I doubt that his constipation is  the source of his symptoms.  RECOMMENDATIONS:  1. We will start him on pantoprazole 40 mg p.o. q.a.m. prescription  given for 30 with 5 refills. We will arrange for him to have a  complete abdominal ultrasound next month rather than just limited  ultrasound.  2. Continue with high-fiber diet and prune juice.  3. Colace 2 tablets daily at bedtime.  4. If ultrasonography is negative for pancreatobiliary tract disease,  may consider diagnostic EGD, particularly if symptoms do not  respond to PPI.  We appreciate the opportunity to participate in the care of this  gentleman.

## 2011-11-16 ENCOUNTER — Telehealth (INDEPENDENT_AMBULATORY_CARE_PROVIDER_SITE_OTHER): Payer: Self-pay | Admitting: *Deleted

## 2011-11-16 ENCOUNTER — Telehealth: Payer: Self-pay

## 2011-11-16 NOTE — Telephone Encounter (Signed)
Talked with patient's daughter Arline Asp. Waiting to hear from Dr. Marvel Plan office about holding Pradaxa or possible EGD on 11/18/2011.

## 2011-11-16 NOTE — Telephone Encounter (Signed)
S: Pt. needs EGD  B: Pt. Is on Pradaxa 75 mg BID A: Dr. Karilyn Cota states that pt. needs to have an EGD and he would like to schedule for Friday (11-18-2011). Dr. Karilyn Cota states that he will advise pt. to take his dose tonight but not to take tomorrow mornings dose until he hears from him.  R: Dr. Karilyn Cota advised that we will contact him with Dr. Marvel Plan recommendations.

## 2011-11-16 NOTE — Telephone Encounter (Signed)
Troy Hinton presented to the office. She states that her Dad is to have a U/S Monday 11-21-11. At the time of his office visit he was given a medication to take 30 minutes prior to breakfast ( Pantoprazole). This is not helping, he continues to have Epigastric Pain. Earlier this morning he was awoke with this pain, he threw up 3 times. Walking from bedroom to kitchen, the length of our waiting room, he experienced shortness of breathe. Vitals per daughter BP 120/80 , HR 82. She gave him some OTC medication for nausea and he was unable to keep this down as well as not being able to keep down his other medications. Troy Hinton is concerned and would appreciate a phone call. She may be reached at [phone].

## 2011-11-17 ENCOUNTER — Other Ambulatory Visit (INDEPENDENT_AMBULATORY_CARE_PROVIDER_SITE_OTHER): Payer: Self-pay | Admitting: *Deleted

## 2011-11-17 DIAGNOSIS — R1110 Vomiting, unspecified: Secondary | ICD-10-CM

## 2011-11-17 NOTE — Telephone Encounter (Signed)
A phone call to Larita Fife this morning, she states that she is awaiting a response from Dr. Dietrich Pates. A little later Sheralyn Boatman called Dewayne Hatch and stated that she had ask Dr. Dietrich Pates he commented that 1 day was not long enough for patient to be off medication but that he would look at Mr. Weinert chart. They will get back with [place] , Ann paged Dr. Karilyn Cota to make him aware.

## 2011-11-17 NOTE — Telephone Encounter (Signed)
Troy Hinton has called back asking about her Dad's Pantoprazole. She states that this is not working and they would like to discontinue it. Also states that Cardiologist was wanting to cut back on his sodium due to the edema in his ankles, and she noted that this medication has it ( sodium) in it. I told her that I would address this with Dr. Karilyn Cota and call her back.

## 2011-11-17 NOTE — Telephone Encounter (Signed)
Per Dr. Karilyn Cota the patient may stop the Pantoprazole as it has not helped with the patient's symtoms. Arline Asp was called and made aware.

## 2011-11-17 NOTE — Telephone Encounter (Signed)
Note faxed to Dr. Karilyn Cota./LV

## 2011-11-17 NOTE — Telephone Encounter (Signed)
Patient has impaired renal function and will need to discontinue dabigatran for at least 48 hours prior to colonoscopy.  He can resume immediately after colonoscopy if no biopsy or polypectomy and 48 hours thereafter if one of these procedures were performed.

## 2011-11-21 ENCOUNTER — Ambulatory Visit (HOSPITAL_COMMUNITY)
Admission: RE | Admit: 2011-11-21 | Discharge: 2011-11-21 | Disposition: A | Discharge status: Home / Self Care | Payer: Medicare Other | Source: Ambulatory Visit | Attending: Cardiology | Admitting: Cardiology

## 2011-11-21 ENCOUNTER — Ambulatory Visit (HOSPITAL_COMMUNITY)
Admission: RE | Admit: 2011-11-21 | Discharge: 2011-11-21 | Disposition: A | Discharge status: Home / Self Care | Payer: Medicare Other | Source: Ambulatory Visit | Attending: Internal Medicine | Admitting: Internal Medicine

## 2011-11-21 ENCOUNTER — Other Ambulatory Visit: Payer: Self-pay | Admitting: *Deleted

## 2011-11-21 ENCOUNTER — Encounter: Payer: Self-pay | Admitting: *Deleted

## 2011-11-21 ENCOUNTER — Other Ambulatory Visit (INDEPENDENT_AMBULATORY_CARE_PROVIDER_SITE_OTHER): Payer: Self-pay | Admitting: Internal Medicine

## 2011-11-21 DIAGNOSIS — I359 Nonrheumatic aortic valve disorder, unspecified: Secondary | ICD-10-CM

## 2011-11-21 DIAGNOSIS — R634 Abnormal weight loss: Secondary | ICD-10-CM

## 2011-11-21 DIAGNOSIS — R109 Unspecified abdominal pain: Secondary | ICD-10-CM | POA: Insufficient documentation

## 2011-11-21 DIAGNOSIS — I714 Abdominal aortic aneurysm, without rupture: Secondary | ICD-10-CM

## 2011-11-22 MED ORDER — SODIUM CHLORIDE 0.45 % IV SOLN
Freq: Once | INTRAVENOUS | Status: AC
  Administered 2011-11-23: 20 mL via INTRAVENOUS

## 2011-11-23 ENCOUNTER — Encounter (HOSPITAL_COMMUNITY)
Admission: RE | Disposition: A | Discharge status: Home / Self Care | Payer: Self-pay | Source: Ambulatory Visit | Attending: Internal Medicine

## 2011-11-23 ENCOUNTER — Ambulatory Visit (HOSPITAL_COMMUNITY)
Admission: RE | Admit: 2011-11-23 | Discharge: 2011-11-23 | Disposition: A | Discharge status: Home / Self Care | Payer: Medicare Other | Source: Ambulatory Visit | Attending: Internal Medicine | Admitting: Internal Medicine

## 2011-11-23 ENCOUNTER — Encounter (HOSPITAL_COMMUNITY): Payer: Self-pay | Admitting: *Deleted

## 2011-11-23 DIAGNOSIS — R630 Anorexia: Secondary | ICD-10-CM

## 2011-11-23 DIAGNOSIS — K2990 Gastroduodenitis, unspecified, without bleeding: Secondary | ICD-10-CM

## 2011-11-23 DIAGNOSIS — R1110 Vomiting, unspecified: Secondary | ICD-10-CM

## 2011-11-23 DIAGNOSIS — R1013 Epigastric pain: Secondary | ICD-10-CM | POA: Insufficient documentation

## 2011-11-23 DIAGNOSIS — K2970 Gastritis, unspecified, without bleeding: Secondary | ICD-10-CM | POA: Insufficient documentation

## 2011-11-23 DIAGNOSIS — K208 Other esophagitis: Secondary | ICD-10-CM

## 2011-11-23 DIAGNOSIS — K2100 Gastro-esophageal reflux disease with esophagitis, without bleeding: Secondary | ICD-10-CM | POA: Insufficient documentation

## 2011-11-23 HISTORY — PX: ESOPHAGOGASTRODUODENOSCOPY: SHX5428

## 2011-11-23 SURGERY — EGD (ESOPHAGOGASTRODUODENOSCOPY)
Anesthesia: Moderate Sedation

## 2011-11-23 MED ORDER — MEPERIDINE HCL 25 MG/ML IJ SOLN
INTRAMUSCULAR | Status: DC | PRN
  Administered 2011-11-23: 15 mg via INTRAVENOUS

## 2011-11-23 MED ORDER — MEPERIDINE HCL 50 MG/ML IJ SOLN
INTRAMUSCULAR | Status: AC
  Filled 2011-11-23: qty 1

## 2011-11-23 MED ORDER — BUTAMBEN-TETRACAINE-BENZOCAINE 2-2-14 % EX AERO
INHALATION_SPRAY | CUTANEOUS | Status: DC | PRN
  Administered 2011-11-23: 2 via TOPICAL

## 2011-11-23 MED ORDER — MIDAZOLAM HCL 5 MG/5ML IJ SOLN
INTRAMUSCULAR | Status: AC
  Filled 2011-11-23: qty 10

## 2011-11-23 MED ORDER — MIDAZOLAM HCL 5 MG/5ML IJ SOLN
INTRAMUSCULAR | Status: DC | PRN
  Administered 2011-11-23: 1 mg via INTRAVENOUS
  Administered 2011-11-23: 0.5 mg via INTRAVENOUS

## 2011-11-23 NOTE — Op Note (Signed)
EGD PROCEDURE REPORT  PATIENT:  Troy Hinton  MR#:  [id number] Birthdate:  December 15, 1916, 78 y.o., male Endoscopist:  Dr. Malissa Hippo, MD Referred By:  Dr. Oneal Deputy. Juanetta Gosling, MD Procedure Date: 11/23/2011  Procedure:   EGD  Indications:  Patient is a 78 year old Caucasian male with anorexia epigastric and lower sternal discomfort. Ultrasound was negative for pancreatic lesion or biliary dilation. He is undergoing diagnostic EGD.            Informed Consent:  The risks, benefits, alternatives & imponderables which include, but are not limited to, bleeding, infection, perforation, drug reaction and potential missed lesion have been reviewed.  The potential for biopsy, lesion removal, esophageal dilation, etc. have also been discussed.  Questions have been answered.  All parties agreeable.  Please see history & physical in medical record for more information.  Medications:  Demerol 15 mg IV Versed 1.5 mg IV Cetacaine spray topically for oropharyngeal anesthesia  Description of procedure:  The endoscope was introduced through the mouth and advanced to the second portion of the duodenum without difficulty or limitations. The mucosal surfaces were surveyed very carefully during advancement of the scope and upon withdrawal.  Findings:  Esophagus:  Normal mucosa. Focal erythema and edema at GE junction. GEJ:  41 cm Stomach:  Some bile noted in the stomach there was no food debris. Folds in the proximal stomach were normal. There was patchy erythema edema and few petechial hemorrhages at gastric body. No erosions or ulcers identified. Pyloric channel was patent. Angularis fundus and cardia were examined by retroflexing the scope normal. Duodenum:  Normal bulbar and post bulbar mucosa.  Therapeutic/Diagnostic Maneuvers Performed:  None  Complications:  None  Impression: Nonerosive gastritis at body and antrum. Mild changes of reflux esophagitis limited to GE junction. His ongoing symptoms  could be secondary to gastritis or one of his medications i.e. Lanoxin or Pradaxa.   Recommendations:  Resume pantoprazole at 40 mg by mouth every morning. H. pylori serology.   REHMAN,NAJEEB U  11/23/2011  7:49 AM  CC: Dr. Fredirick Maudlin, MD, MD & Dr. Bonnetta Barry ref. provider found

## 2011-11-23 NOTE — H&P (Signed)
This is an update to history and physical from 11/01/2011. Patient's ultrasound was negative for biliary dilation or pancreatic neoplasm. AAA is essentially unchanged. He has not experience symptomatic improvement with pantoprazole which was therefore discontinued few days ago. He is undergoing diagnostic EGD.

## 2011-11-28 ENCOUNTER — Ambulatory Visit (INDEPENDENT_AMBULATORY_CARE_PROVIDER_SITE_OTHER): Payer: Medicare Other | Admitting: Cardiology

## 2011-11-28 ENCOUNTER — Encounter: Payer: Self-pay | Admitting: Cardiology

## 2011-11-28 DIAGNOSIS — I350 Nonrheumatic aortic (valve) stenosis: Secondary | ICD-10-CM

## 2011-11-28 DIAGNOSIS — I359 Nonrheumatic aortic valve disorder, unspecified: Secondary | ICD-10-CM

## 2011-11-28 DIAGNOSIS — I714 Abdominal aortic aneurysm, without rupture: Secondary | ICD-10-CM

## 2011-11-28 DIAGNOSIS — Z7901 Long term (current) use of anticoagulants: Secondary | ICD-10-CM

## 2011-11-28 DIAGNOSIS — R630 Anorexia: Secondary | ICD-10-CM

## 2011-11-28 DIAGNOSIS — I4891 Unspecified atrial fibrillation: Secondary | ICD-10-CM

## 2011-11-28 LAB — COMPREHENSIVE METABOLIC PANEL
AST: 19 U/L (ref 0–37)
Albumin: 4.2 g/dL (ref 3.5–5.2)
BUN: 26 mg/dL — ABNORMAL HIGH (ref 6–23)
CO2: 29 mEq/L (ref 19–32)
Calcium: 8.7 mg/dL (ref 8.4–10.5)
Chloride: 100 mEq/L (ref 96–112)
Glucose, Bld: 139 mg/dL — ABNORMAL HIGH (ref 70–99)
Potassium: 4.1 mEq/L (ref 3.5–5.3)

## 2011-11-28 MED ORDER — VERAPAMIL HCL ER 120 MG PO TBCR: 120.0000 mg | EXTENDED_RELEASE_TABLET | Freq: Every day | ORAL | Status: DC

## 2011-11-28 NOTE — Progress Notes (Signed)
Patient ID: Troy Hinton, male   DOB: 1917/05/13, 78 y.o.   MRN: [id number] HPI: Scheduled return visit for this very nice and mentally sharp elderly gentleman followed for aortic stenosis, peripheral edema, chronic kidney disease and atrial fibrillation.  Since his last visit, he has done fairly well, but has been troubled by GI symptoms prompting evaluation by Dr. Karilyn Cota.  An initial trial of Protonix was not helpful leading to an EGD that demonstrated mild gastritis.  Patient has not undergone colonoscopy for years.  PPI has been restarted with improvement in his symptoms; however, anorexia and weight loss continue.  Although not very active, he has occasional mild dyspnea on exertion.  He denies palpitations, lightheadedness, chest discomfort or syncope.  Blood pressure and heart rate have been monitored at home and have been good.  He is treated with a reduced dose of pradaxa due to mild to moderate renal impairment plus treatment with verapamil.  Prior to Admission medications   Medication Sig Start Date End Date Taking? Authorizing Provider  digoxin (LANOXIN) 0.125 MG tablet Take 1 tablet (125 mcg total) by mouth daily. 05/24/11  Yes Gerrit Friends. Dietrich Pates, MD  Docusate Calcium (STOOL SOFTENER PO) Take 100 mg by mouth daily.   Yes Historical Provider, MD  furosemide (LASIX) 80 MG tablet Take 1 tablet (80 mg total) by mouth daily. 09/27/11 09/26/12 Yes Gerrit Friends. Randall Rampersad, MD  HYDROcodone-acetaminophen (VICODIN) 5-500 MG per tablet Take 1 tablet by mouth every 6 (six) hours as needed.     Yes Historical Provider, MD  Multiple Vitamins-Minerals (MULTIVITAMIN WITH MINERALS) tablet Take 1 tablet by mouth daily.     Yes Historical Provider, MD  nitroGLYCERIN (NITROSTAT) 0.4 MG SL tablet Place 0.4 mg under the tongue every 5 (five) minutes as needed.     Yes Historical Provider, MD  Omega-3 Fatty Acids (FISH OIL) 300 MG CAPS Take 1,200 mg by mouth daily.    Yes Historical Provider, MD  pantoprazole  (PROTONIX) 40 MG tablet Take 1 tablet (40 mg total) by mouth daily. 11/01/11 10/31/12 Yes Malissa Hippo, MD  PRADAXA 75 MG CAPS TAKE 1 CAPSULE BY MOUTH EVERY 12 HOURS. 09/14/11  Yes Gerrit Friends. Dietrich Pates, MD  zolpidem (AMBIEN) 5 MG tablet Take 0.5 tablets (2.5 mg total) by mouth at bedtime as needed (may repeat x1). 05/24/11  Yes Gerrit Friends. Katena Petitjean, MD  verapamil (CALAN-SR) 120 MG CR tablet Take 1 tablet (120 mg total) by mouth at bedtime. 11/28/11 11/27/12  Gerrit Friends. Dietrich Pates, MD  No Known Allergies    Past medical history, social history, and family history reviewed and updated.  ROS: See history of present illness.  PHYSICAL EXAM: BP 117/61  Pulse 49  Ht 5\' 8"  (1.727 m)  Wt 65.772 kg (145 lb)  BMI 22.05 kg/m2  SpO2 98%  General-Well developed; no acute distress Body habitus-thin Neck-No JVD; no carotid bruits Lungs-clear lung fields; resonant to percussion; moderate kyphoscoliosis Cardiovascular-normal PMI; normal S1 and S2 Abdomen-normal bowel sounds; soft and non-tender without masses or organomegaly Musculoskeletal-No deformities, no cyanosis or clubbing Neurologic-Normal cranial nerves; symmetric strength and tone Skin-Warm, no significant lesions Extremities-distal pulses intact; 2+ ankle and pretibial edema  ASSESSMENT AND PLAN:  [place] Bing, MD 11/28/2011 2:18 PM

## 2011-11-28 NOTE — Assessment & Plan Note (Signed)
PTT will be measured to verify dabigatran effect.

## 2011-11-28 NOTE — Assessment & Plan Note (Signed)
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  It appears that this will never be a significant clinical problem for this elderly gentleman.

## 2011-11-28 NOTE — Patient Instructions (Addendum)
Your physician recommends that you schedule a follow-up appointment in: 4 months   Your physician has recommended you make the following change in your medication:  DECREASE Verapamil (Calan) to 120 mg daily   Your physician recommends that you return for lab work in: Today (Digoxin level, PTT, CMET)

## 2011-11-28 NOTE — Assessment & Plan Note (Signed)
Anticoagulation has been stable and therapeutic, and patient is asymptomatic.  Heart rate is on the slow side, and patient has experienced some constipation, likely related to treatment with verapamil.  The dose of this agent will be reduced to 120 mg per day.  Patient will continue to monitor heart rate and blood pressure with his home sphygmomanometer and call for tachycardia.

## 2011-11-28 NOTE — Assessment & Plan Note (Signed)
Patient continues to have no or minimal symptoms related to aortic valve disease.  Continued observation is appropriate.

## 2011-11-29 ENCOUNTER — Encounter: Payer: Self-pay | Admitting: Cardiology

## 2011-12-01 ENCOUNTER — Telehealth: Payer: Self-pay | Admitting: *Deleted

## 2011-12-07 ENCOUNTER — Other Ambulatory Visit: Payer: Self-pay | Admitting: Cardiology

## 2011-12-14 ENCOUNTER — Telehealth: Payer: Self-pay | Admitting: *Deleted

## 2011-12-14 NOTE — Telephone Encounter (Signed)
Patient notified that Pradaxa 75 mg samples #[id number] received and are ready for pick up.

## 2011-12-19 ENCOUNTER — Other Ambulatory Visit: Payer: Self-pay | Admitting: Cardiology

## 2012-01-02 ENCOUNTER — Ambulatory Visit (INDEPENDENT_AMBULATORY_CARE_PROVIDER_SITE_OTHER): Payer: Medicare Other | Admitting: Internal Medicine

## 2012-01-02 ENCOUNTER — Encounter (INDEPENDENT_AMBULATORY_CARE_PROVIDER_SITE_OTHER): Payer: Self-pay | Admitting: Internal Medicine

## 2012-01-02 ENCOUNTER — Telehealth: Payer: Self-pay | Admitting: Cardiology

## 2012-01-02 DIAGNOSIS — R630 Anorexia: Secondary | ICD-10-CM

## 2012-01-02 DIAGNOSIS — R634 Abnormal weight loss: Secondary | ICD-10-CM | POA: Insufficient documentation

## 2012-01-02 DIAGNOSIS — R1013 Epigastric pain: Secondary | ICD-10-CM | POA: Insufficient documentation

## 2012-01-02 MED ORDER — MEGESTROL ACETATE 40 MG/ML PO SUSP: 200.0000 mg | Freq: Every day | ORAL | Status: AC

## 2012-01-02 NOTE — Progress Notes (Signed)
Presenting complaint; Followup visit for anorexia, epigastric pain, and weight loss. Subjective: Patient is a 78 year old Caucasian male who is here for scheduled visit accompanied by his daughter Troy Hinton. He does not feel much better. He has lost another 10 pounds since his last visit of 11/01/2011. He continues to experience epigastric pain but it is not every day. He continues to complain of anorexia. His daughter says that he eats evening we'll put her in generally eats well. He he denies nausea vomiting melena or rectal bleeding. His bowels are moving better since he's been on stool softener. Following his last visit he had upper abdominal ultrasound which is negative for dilated bile ducts or liver lesions. Pancreas was not well seen a prominent left ureter but no hydronephrosis and AAA was felt to be stable. He then had esophagogastroduodenoscopy on 11/23/2011 30 and mild changes of reflux esophagitis at GE junction and erosive gastritis. His H. pylori serology was negative. He has been maintained on PPI. He also had lab studies by Dr. Dietrich Pates on 11/28/2011 his dig level and  LFTs were normal; his BUN and creatinine were mildly elevated but not changed from previous values. Serum calcium was also normal. Current Medications: Current Outpatient Prescriptions on File Prior to Visit  Medication Sig Dispense Refill  . digoxin (LANOXIN) 0.125 MG tablet TAKE ONE TABLET DAILY.  30 tablet  6  . Docusate Calcium (STOOL SOFTENER PO) Take 200 mg by mouth daily.       . furosemide (LASIX) 80 MG tablet Take 1 tablet (80 mg total) by mouth daily.  30 tablet  12  . HYDROcodone-acetaminophen (VICODIN) 5-500 MG per tablet Take 1 tablet by mouth every 6 (six) hours as needed.        . nitroGLYCERIN (NITROSTAT) 0.4 MG SL tablet Place 0.4 mg under the tongue every 5 (five) minutes as needed.        . pantoprazole (PROTONIX) 40 MG tablet Take 1 tablet (40 mg total) by mouth daily.  30 tablet  5  . PRADAXA 75 MG  CAPS TAKE 1 CAPSULE BY MOUTH EVERY 12 HOURS.  60 capsule  6  . verapamil (CALAN-SR) 120 MG CR tablet Take 1 tablet (120 mg total) by mouth at bedtime.  90 tablet  3  . zolpidem (AMBIEN) 5 MG tablet Take 0.5 tablets (2.5 mg total) by mouth at bedtime as needed (may repeat x1).  30 tablet  1    Objective: Blood pressure 118/74, pulse 70, temperature 97.4 F (36.3 C), temperature source Oral, resp. rate 14, height 5\' 10"  (1.778 m), weight 141 lb 4.8 oz (64.093 kg). Patient does not appear to be in any distress. Conjunctiva is pink. Sclera is nonicteric Oroharyngeal mucosa is normal. No neck masses or thyromegaly noted. Cardiac exam with regular rhythm normal S1 and S2; he has faint murmur at LSB and aortic area. Lungs are clear to auscultation. Abdomen is flat. Bowel sounds are normal. No bruit is noted. Soft abdomen without organomegaly or masses.  He has 1+ edema involving distal half of both legs.  Labs/studies Results: Comprehensive chemistry panel and dig. level from 11/28/2011 reviewed.   Assessment: Patient appears to be having less pain since he was begun on PPI;  however he has lost another 10 pounds in the last 2 months and remains with anorexia. No significant findings on ultrasound and EGD. He also is not dig toxic and has serum calcium and  transaminases. His pancreas was not well seen on ultrasound. It is still  possible that his symptoms are due to one or more of this medications. He may need further evaluation depending on his course over the next few weeks   Plan: Continue pantoprazole at current dose. Discontinue fish oil and multivitamin for now. Megestrol 200 mg by mouth daily. Weight check in 4 weeks. Unless he is better we'll proceed with abdominopelvic CT.

## 2012-01-02 NOTE — Telephone Encounter (Signed)
Spoke to patient's daughter, who states that Mr Markoff was seen by Dr Karilyn Cota this morning due to constipation and stomach pain.  According to daughter, Dr Karilyn Cota suggested that digoxin may be causing this and inquired as to wether this could be discontinued.  I told her that this was unlikely to be advised, however I would consult with Dr Dietrich Pates regarding this and call her back with his recommendations.

## 2012-01-02 NOTE — Telephone Encounter (Signed)
PT IS HAVING TROUBLE WITH STOMIC AND WOULD LIKE TO STOP TAKING DIGOXIN. PATIENT SEEN DR [facility] THIS MORNING.

## 2012-01-02 NOTE — Patient Instructions (Signed)
New medication is Megestrol 1 teaspoon full(200 mg) by mouth daily. Call if you have any side effects with this medication. Please note that the fish oil and multivitamin has been discontinued. Weight check in one month.

## 2012-01-06 ENCOUNTER — Telehealth: Payer: Self-pay | Admitting: *Deleted

## 2012-01-06 MED ORDER — DILTIAZEM HCL ER 180 MG PO CP24: 180.0000 mg | ORAL_CAPSULE | Freq: Every day | ORAL | Status: DC

## 2012-01-06 NOTE — Telephone Encounter (Signed)
GI toxicity secondary to digoxin with an appropriate dose for patient's renal function and a therapeutic level is unlikely; however, a trial off this medication certainly feasible.  Verapamil could also be contributing to current problems, particularly constipation, which in turn could cause anorexia.  Discontinue digoxin. Discontinue verapamil. Diltiazem 180 mg per day. Rhythm strip in one week

## 2012-01-06 NOTE — Telephone Encounter (Signed)
Advised daughter of changes and need for f/u next Friday.  Verbalized understanding.

## 2012-01-09 ENCOUNTER — Telehealth (INDEPENDENT_AMBULATORY_CARE_PROVIDER_SITE_OTHER): Payer: Self-pay | Admitting: *Deleted

## 2012-01-09 ENCOUNTER — Telehealth: Payer: Self-pay | Admitting: Cardiology

## 2012-01-09 NOTE — Telephone Encounter (Signed)
Advised patient's daughter that 180 mg is the dose he is to be on in lieu of discontinuing Verapamil and diltiazem

## 2012-01-09 NOTE — Telephone Encounter (Signed)
DAUGHTER CALLING ABOUT NEW MEDICATION WE PUT HIM ON DILTIAZEM HE WAS ALREADY ON 90MG  FROM DR Juanetta Gosling AND WE STARTED HIM ON 180 MG. JUST WANTED [place] TO KNOW THIS AND IF ANYTHING NEEDS CHANGED PLEASE CALL HER BACK. CINDY PUTNAM -[phone] HOME AND [phone] CELL.

## 2012-01-09 NOTE — Telephone Encounter (Signed)
Increase in diltiazem dose is appropriate. Continue with plan as outlined in his recent office visit

## 2012-01-09 NOTE — Telephone Encounter (Signed)
Troy Hinton called office to make [place] aware that Dr. Dietrich Pates had taken her Dad off of 2 medications. They are Verapamil , Digoxin. He put him on Diltazem 180 mg, and stated in her message that Dr. Juanetta Gosling had already had him on 90 mg. If we have any questions to call her.

## 2012-01-09 NOTE — Telephone Encounter (Signed)
Noted to change in patient's medication; hopefully it would help the symptoms

## 2012-01-10 NOTE — Telephone Encounter (Signed)
Changes in medications noted

## 2012-01-13 ENCOUNTER — Ambulatory Visit (INDEPENDENT_AMBULATORY_CARE_PROVIDER_SITE_OTHER): Payer: Medicare Other | Admitting: *Deleted

## 2012-01-13 VITALS — HR 85

## 2012-01-13 DIAGNOSIS — R001 Bradycardia, unspecified: Secondary | ICD-10-CM

## 2012-01-13 DIAGNOSIS — I498 Other specified cardiac arrhythmias: Secondary | ICD-10-CM

## 2012-01-13 NOTE — Progress Notes (Signed)
Presents today for Rhythm strip s/p discontinuation of Verapamil secondary to bradycardia (49).  HR within normal limits and patient reports no complaints at this time.  See scanned rhythm strip.  Mediations reconciled and pt is taking medications as prescribed.  Agree-Dr. Drexel Bing, 01/15/2012

## 2012-02-17 ENCOUNTER — Telehealth: Payer: Self-pay | Admitting: Cardiology

## 2012-02-17 NOTE — Telephone Encounter (Signed)
PT HAS BEEN HAVING SOB, EVEN WHEN JUST GOING TO BATHROOM.  PT NORMAL KEEP FLUID ON THEM AND OVER THE LAST FEW WEEKS IT HAS GONE DOWN IN HIS ANKLES.   PLEASE CALL DAUGHTER ON CELL PHONE IF YOU DON'T REACH HER ON HOME PHONE

## 2012-02-17 NOTE — Telephone Encounter (Signed)
Spoke with daughter, who states he is in no acute distress at this time.  States that he is having increased SOB and edema.  Daughter states she is going out of the country and is concerned that he has needs that should be addressed prior to her departure.  Advised her to contact Dr Juanetta Gosling as well if she feels this needs to be checked prior to the weekend.  Appointment made with Dr Dietrich Pates for Tuesday the 9th.

## 2012-02-21 ENCOUNTER — Ambulatory Visit: Payer: Medicare Other | Admitting: Cardiology

## 2012-03-27 ENCOUNTER — Ambulatory Visit (INDEPENDENT_AMBULATORY_CARE_PROVIDER_SITE_OTHER): Payer: Medicare Other | Admitting: Cardiology

## 2012-03-27 ENCOUNTER — Encounter: Payer: Self-pay | Admitting: *Deleted

## 2012-03-27 ENCOUNTER — Encounter: Payer: Self-pay | Admitting: Cardiology

## 2012-03-27 VITALS — BP 95/65 | HR 135 | Ht 68.0 in | Wt 142.0 lb

## 2012-03-27 DIAGNOSIS — I4891 Unspecified atrial fibrillation: Secondary | ICD-10-CM

## 2012-03-27 DIAGNOSIS — Z7901 Long term (current) use of anticoagulants: Secondary | ICD-10-CM

## 2012-03-27 DIAGNOSIS — R630 Anorexia: Secondary | ICD-10-CM

## 2012-03-27 DIAGNOSIS — I350 Nonrheumatic aortic (valve) stenosis: Secondary | ICD-10-CM

## 2012-03-27 DIAGNOSIS — R609 Edema, unspecified: Secondary | ICD-10-CM

## 2012-03-27 DIAGNOSIS — I359 Nonrheumatic aortic valve disorder, unspecified: Secondary | ICD-10-CM

## 2012-03-27 MED ORDER — DILTIAZEM HCL ER COATED BEADS 240 MG PO CP24: 240.0000 mg | ORAL_CAPSULE | Freq: Every day | ORAL | Status: DC

## 2012-03-27 NOTE — Progress Notes (Signed)
Patient ID: Troy Hinton, male   DOB: Jul 08, 1917, 78 y.o.   MRN: [id number]  HPI: Scheduled return visit for this delightful older gentleman with atrial fibrillation requiring anticoagulation.  Since his last visit, he has done well symptomatically.  He denies fatigue, palpitations, chest discomfort or dyspnea.  Blood pressure determinations at home showed generally normal systolic and diastolic pressures and heart rate less than 100.  In recent weeks, a few systolics in the 140-160 range have been noted.  Diltiazem was substituted for verapamil approximately one month ago at the request of Dr. Karilyn Cota.  Constipation has resolved.  Prior to Admission medications   Medication Sig Start Date End Date Taking? Authorizing Provider  Docusate Calcium (STOOL SOFTENER PO) Take 200 mg by mouth daily.    Yes Historical Provider, MD  furosemide (LASIX) 40 MG tablet Take 40 mg by mouth 2 (two) times daily.   Yes Historical Provider, MD  HYDROcodone-acetaminophen (VICODIN) 5-500 MG per tablet Take 1 tablet by mouth every 6 (six) hours as needed.     Yes Historical Provider, MD  nitroGLYCERIN (NITROSTAT) 0.4 MG SL tablet Place 0.4 mg under the tongue every 5 (five) minutes as needed.     Yes Historical Provider, MD  pantoprazole (PROTONIX) 40 MG tablet Take 1 tablet (40 mg total) by mouth daily. 11/01/11 10/31/12 Yes Malissa Hippo, MD  PRADAXA 75 MG CAPS TAKE 1 CAPSULE BY MOUTH EVERY 12 HOURS. 09/14/11  Yes Kathlen Brunswick, MD  zolpidem (AMBIEN) 5 MG tablet Take 5 mg by mouth at bedtime as needed. 05/24/11  Yes Kathlen Brunswick, MD  diltiazem (CARDIZEM CD) 240 MG 24 hr capsule Take 1 capsule (240 mg total) by mouth daily. 03/27/12 03/27/13  Kathlen Brunswick, MD   No Known Allergies    Past medical history, social history, and family history reviewed and updated.  ROS: Denies orthopnea, PND, lightheadedness or syncope.  Variable pedal edema has been present, more prominent in recent days.  PHYSICAL  EXAM: BP 95/65  Pulse 135  Ht 5\' 8"  (1.727 m)  Wt 64.411 kg (142 lb)  BMI 21.59 kg/m2  General-Well developed; no acute distress Body habitus-proportionate weight and height Neck-No JVD; no carotid bruits Lungs-clear lung fields; resonant to percussion Cardiovascular-normal PMI; normal S1 and preserved A2; grade 2/6 basilar systolic ejection murmur Abdomen-normal bowel sounds; soft and non-tender without masses or organomegaly Musculoskeletal-No deformities, no cyanosis or clubbing Neurologic-Normal cranial nerves; symmetric strength and tone Skin-Warm, no significant lesions Extremities-distal pulses intact; 2+ edema To the knees bilaterally  EKG: Atrial fibrillation with a rapid ventricular response; ventricular rate of 136 bpm; low voltage; minor nonspecific ST-T wave abnormality.  ASSESSMENT AND PLAN:  Dardanelle Bing, MD 03/27/2012 5:53 PM

## 2012-03-27 NOTE — Assessment & Plan Note (Signed)
Weight loss has stabilized after a reduction of 15 pounds over the past twelve months.

## 2012-03-27 NOTE — Assessment & Plan Note (Signed)
Patient has experienced no complications or other difficulties and treatment with this novel oral anticoagulant, which will be continued.

## 2012-03-27 NOTE — Patient Instructions (Addendum)
Your physician recommends that you schedule a follow-up appointment in:  1 - 2 months with Dr Dietrich Pates 2 - 1 week for a rhythm strip  Your physician recommends that you return for lab work in: 3 weeks (you will receive a letter)  Stools x 3 and return to the office asap  Elevate legs  Your physician has recommended you make the following change in your medication:  1 - Change Lasix (furosemide) to 40 mg in the am and 40 in the pm 2 - Change Diltiazem to 240 mg daily

## 2012-03-27 NOTE — Assessment & Plan Note (Signed)
Heart rate is moderately elevated despite substitution of 180 mg of diltiazem for 120 mg of verapamil.  Generally, these agents should be about equipotent.  Nonetheless, additional medication will be required.  Dose of diltiazem will be increased to 240 mg per day and heart rate reassessed in one week.  Sphygmomanometer used at home may not be providing an adequate determination of ventricular rate.  We will consider the addition of metoprolol if diltiazem fails to provide heart rate control.

## 2012-03-27 NOTE — Progress Notes (Deleted)
**Note De-Identified Troy Hinton Obfuscation** Name: Troy Hinton    DOB: Apr 17, 1917  Age: 78 y.o.  MR#: [id number]       PCP:  Fredirick Maudlin, MD, MD      Insurance: @PAYORNAME @   CC:    Chief Complaint  Patient presents with  . 4 month f/u    Pt. c/o SOB and swelling in legs. List+    VS BP 95/65  Pulse 135  Ht 5\' 8"  (1.727 m)  Wt 142 lb (64.411 kg)  BMI 21.59 kg/m2  Weights Current Weight  03/27/12 142 lb (64.411 kg)  01/02/12 141 lb 4.8 oz (64.093 kg)  11/28/11 145 lb (65.772 kg)    Blood Pressure  BP Readings from Last 3 Encounters:  03/27/12 95/65  01/02/12 118/74  11/28/11 117/61     Admit date:  (Not on file) Last encounter with RMR:  02/17/2012   Allergy No Known Allergies  Current Outpatient Prescriptions  Medication Sig Dispense Refill  . diltiazem (DILACOR XR) 180 MG 24 hr capsule Take 1 capsule (180 mg total) by mouth daily.  30 capsule  12  . Docusate Calcium (STOOL SOFTENER PO) Take 200 mg by mouth daily.       . furosemide (LASIX) 80 MG tablet Take 1 tablet (80 mg total) by mouth daily.  30 tablet  12  . HYDROcodone-acetaminophen (VICODIN) 5-500 MG per tablet Take 1 tablet by mouth every 6 (six) hours as needed.        . nitroGLYCERIN (NITROSTAT) 0.4 MG SL tablet Place 0.4 mg under the tongue every 5 (five) minutes as needed.        . pantoprazole (PROTONIX) 40 MG tablet Take 1 tablet (40 mg total) by mouth daily.  30 tablet  5  . PRADAXA 75 MG CAPS TAKE 1 CAPSULE BY MOUTH EVERY 12 HOURS.  60 capsule  6  . zolpidem (AMBIEN) 5 MG tablet Take 5 mg by mouth at bedtime as needed.      Marland Kitchen DISCONTD: zolpidem (AMBIEN) 5 MG tablet Take 0.5 tablets (2.5 mg total) by mouth at bedtime as needed (may repeat x1).  30 tablet  1    Discontinued Meds:    Medications Discontinued During This Encounter  Medication Reason  . zolpidem (AMBIEN) 5 MG tablet     Patient Active Problem List  Diagnoses  . Atrial fibrillation  . Abdominal aortic aneurysm  . Aortic stenosis  . Chronic anticoagulation  . Chronic  kidney disease  . Edema  . Anorexia  . Constipation  . Weight loss  . Epigastric pain    LABS No visits with results within 3 Month(s) from this visit. Latest known visit with results is:  Office Visit on 11/28/2011  Component Date Value  . Digoxin Level 11/28/2011 1.4   . aPTT 11/28/2011 38*  . Sodium 11/28/2011 139   . Potassium 11/28/2011 4.1   . Chloride 11/28/2011 100   . CO2 11/28/2011 29   . Glucose, Bld 11/28/2011 139*  . BUN 11/28/2011 26*  . Creat 11/28/2011 1.47*  . Total Bilirubin 11/28/2011 0.7   . Alkaline Phosphatase 11/28/2011 80   . AST 11/28/2011 19   . ALT 11/28/2011 14   . Total Protein 11/28/2011 6.1   . Albumin 11/28/2011 4.2   . Calcium 11/28/2011 8.7      Results for this Opt Visit:     Results for orders placed in visit on 11/28/11  DIGOXIN LEVEL      Component Value Range   Digoxin **Note De-Identified Troy Hinton Obfuscation** Level 1.4  0.8 - 2.0 (ng/mL)  APTT      Component Value Range   aPTT 38 (*) 24 - 37 (seconds)  COMPREHENSIVE METABOLIC PANEL      Component Value Range   Sodium 139  135 - 145 (mEq/L)   Potassium 4.1  3.5 - 5.3 (mEq/L)   Chloride 100  96 - 112 (mEq/L)   CO2 29  19 - 32 (mEq/L)   Glucose, Bld 139 (*) 70 - 99 (mg/dL)   BUN 26 (*) 6 - 23 (mg/dL)   Creat 1.61 (*) 0.96 - 1.35 (mg/dL)   Total Bilirubin 0.7  0.3 - 1.2 (mg/dL)   Alkaline Phosphatase 80  39 - 117 (U/L)   AST 19  0 - 37 (U/L)   ALT 14  0 - 53 (U/L)   Total Protein 6.1  6.0 - 8.3 (g/dL)   Albumin 4.2  3.5 - 5.2 (g/dL)   Calcium 8.7  8.4 - 04.5 (mg/dL)    EKG No orders found for this or any previous visit.   Prior Assessment and Plan Problem List as of 03/27/2012          Cardiology Problems   Atrial fibrillation   Last Assessment & Plan Note   11/28/2011 Office Visit Signed 11/28/2011  2:24 PM by Kathlen Brunswick, MD    Anticoagulation has been stable and therapeutic, and patient is asymptomatic.  Heart rate is on the slow side, and patient has experienced some constipation, likely related  to treatment with verapamil.  The dose of this agent will be reduced to 120 mg per day.  Patient will continue to monitor heart rate and blood pressure with his home sphygmomanometer and call for tachycardia.    Abdominal aortic aneurysm   Last Assessment & Plan Note   11/28/2011 Office Visit Signed 11/28/2011  2:22 PM by Kathlen Brunswick, MD    Recent abdominal ultrasound shows no change in aneurysm.  It appears that this will never be a significant clinical problem for this elderly gentleman.    Aortic stenosis   Last Assessment & Plan Note   11/28/2011 Office Visit Signed 11/28/2011  2:23 PM by Kathlen Brunswick, MD    Patient continues to have no or minimal symptoms related to aortic valve disease.  Continued observation is appropriate.      Other   Chronic anticoagulation   Last Assessment & Plan Note   11/28/2011 Office Visit Signed 11/28/2011  2:24 PM by Kathlen Brunswick, MD    PTT will be measured to verify dabigatran effect.    Chronic kidney disease   Last Assessment & Plan Note   09/27/2011 Office Visit Signed 09/27/2011  6:47 PM by Kathlen Brunswick, MD    Moderate renal dysfunction with adequate GFR to allow use of dabigatran.    Edema   Last Assessment & Plan Note   09/27/2011 Office Visit Signed 09/27/2011  6:48 PM by Kathlen Brunswick, MD    Pedal edema remains substantial with little apparent benefit from moderate dose furosemide.  Verapamil will be substituted for diltiazem in an attempt to decrease edema.  Patient has also complained of anorexia, which could be related to diltiazem.  Dose of furosemide will be increased to 80 mg per day, but I am disinclined to treat with high-dose diuretic, since pedal edemas not causing any significant health problems.    Anorexia   Constipation   Weight loss   Epigastric pain **Note De-Identified Shadana Pry Obfuscation** Imaging: No results found.   FRS Calculation: Score not calculated

## 2012-03-27 NOTE — Assessment & Plan Note (Signed)
Edema is more prominent at this visit.  Compliance with low salt diet and leg elevation is questionable.  He is not wearing his compression stockings.  Furosemide will be given in divided doses in an attempt to enhance efficacy, while importance of above non-pharmaceutical measures was emphasized.  In the absence of discomfort, skin compromise or other adverse effects, edema appears to be relatively benign.

## 2012-03-27 NOTE — Assessment & Plan Note (Signed)
Aortic stenosis was said to be moderate on echocardiography 16 months ago, but appears to be mild by physical examination.  The absence of symptoms despite a heart rate of 136 provide further reassurance that stenosis is not hemodynamically significant.

## 2012-03-30 ENCOUNTER — Encounter (INDEPENDENT_AMBULATORY_CARE_PROVIDER_SITE_OTHER): Payer: Medicare Other | Admitting: *Deleted

## 2012-03-30 DIAGNOSIS — Z7901 Long term (current) use of anticoagulants: Secondary | ICD-10-CM

## 2012-04-02 NOTE — Progress Notes (Signed)
**Note De-Identified Troy Hinton Obfuscation** Addended by: Demetrios Loll on: 04/02/2012 03:20 PM   Modules accepted: Orders

## 2012-04-04 ENCOUNTER — Ambulatory Visit (INDEPENDENT_AMBULATORY_CARE_PROVIDER_SITE_OTHER): Payer: Medicare Other | Admitting: *Deleted

## 2012-04-04 ENCOUNTER — Other Ambulatory Visit: Payer: Self-pay | Admitting: *Deleted

## 2012-04-04 VITALS — BP 124/73 | HR 153

## 2012-04-04 DIAGNOSIS — I4891 Unspecified atrial fibrillation: Secondary | ICD-10-CM

## 2012-04-04 MED ORDER — METOPROLOL TARTRATE 25 MG PO TABS: 25.0000 mg | ORAL_TABLET | Freq: Two times a day (BID) | ORAL | Status: DC

## 2012-04-04 MED ORDER — BLOOD PRESSURE MONITOR/M CUFF MISC: 1.0000 | Freq: Once | Status: DC

## 2012-04-04 NOTE — Patient Instructions (Signed)
Your physician recommends that you schedule a follow-up appointment in: 1 week for a blood pressure and rhythm strip  Your physician has recommended you make the following change in your medication:  1 - Start Metoprolol 25 mg twice daily

## 2012-04-04 NOTE — Progress Notes (Signed)
Presents to the office for blood pressure check and rhythm strip.  Blood pressures at home have been optimal, for the most part, except for a few readings in the 80's systolic;  however monitor has not adequately measured heart rate.  Ventricular rate is approximately 150 today.  Spoke with Dr Dietrich Pates, who reviewed vital signs and ordered metoprolol 25 mg twice a day with a recheck in 1 week.  Patient will have [facility] calibrate current cuff.  A prescription was provided for an automatic arm cuff, if the current wrist cuff cannot be calibrated.

## 2012-04-10 ENCOUNTER — Ambulatory Visit (INDEPENDENT_AMBULATORY_CARE_PROVIDER_SITE_OTHER): Payer: Medicare Other | Admitting: *Deleted

## 2012-04-10 VITALS — BP 92/70 | HR 93

## 2012-04-10 DIAGNOSIS — I4891 Unspecified atrial fibrillation: Secondary | ICD-10-CM

## 2012-04-10 NOTE — Progress Notes (Signed)
Presents for return blood pressure check and rhythm strip.  States feeling good today.  Medication list reconciled and pt is taking all medications, as prescribed.  Rhythm strip completed with varying rate, due to a fib.

## 2012-04-12 ENCOUNTER — Other Ambulatory Visit: Payer: Self-pay | Admitting: *Deleted

## 2012-04-12 DIAGNOSIS — Z7901 Long term (current) use of anticoagulants: Secondary | ICD-10-CM

## 2012-04-13 ENCOUNTER — Encounter: Payer: Self-pay | Admitting: Cardiology

## 2012-04-13 NOTE — Progress Notes (Signed)
Patient ID: Troy Hinton, male   DOB: 04-15-1917, 78 y.o.   MRN: [id number]  Heart rate control is adequate. Blood pressures are on the low side, but patient reports no symptoms attributable to hypotension. Continue current medication.

## 2012-04-17 LAB — COMPREHENSIVE METABOLIC PANEL
AST: 23 U/L (ref 0–37)
Albumin: 3.8 g/dL (ref 3.5–5.2)
Alkaline Phosphatase: 68 U/L (ref 39–117)
Chloride: 105 mEq/L (ref 96–112)
Glucose, Bld: 89 mg/dL (ref 70–99)
Potassium: 4.2 mEq/L (ref 3.5–5.3)
Sodium: 143 mEq/L (ref 135–145)
Total Protein: 6.1 g/dL (ref 6.0–8.3)

## 2012-04-17 LAB — CBC
Hemoglobin: 14.4 g/dL (ref 13.0–17.0)
MCHC: 34.4 g/dL (ref 30.0–36.0)
WBC: 7.5 10*3/uL (ref 4.0–10.5)

## 2012-04-18 ENCOUNTER — Encounter: Payer: Self-pay | Admitting: Cardiology

## 2012-04-19 ENCOUNTER — Encounter: Payer: Self-pay | Admitting: *Deleted

## 2012-04-19 ENCOUNTER — Other Ambulatory Visit: Payer: Self-pay | Admitting: *Deleted

## 2012-04-19 DIAGNOSIS — N289 Disorder of kidney and ureter, unspecified: Secondary | ICD-10-CM

## 2012-04-25 NOTE — Progress Notes (Signed)
Patient ID: Troy Hinton, male   DOB: 31-Dec-1916, 78 y.o.   MRN: [id number]  Agree with plan as described.  Rhythm strip is not available for review.

## 2012-05-11 ENCOUNTER — Other Ambulatory Visit: Payer: Self-pay | Admitting: Cardiology

## 2012-05-12 LAB — COMPREHENSIVE METABOLIC PANEL
ALT: 20 U/L (ref 0–53)
Alkaline Phosphatase: 73 U/L (ref 39–117)
Sodium: 140 mEq/L (ref 135–145)
Total Bilirubin: 0.5 mg/dL (ref 0.3–1.2)
Total Protein: 5.8 g/dL — ABNORMAL LOW (ref 6.0–8.3)

## 2012-05-12 LAB — CBC
MCH: 34.4 pg — ABNORMAL HIGH (ref 26.0–34.0)
MCHC: 35.5 g/dL (ref 30.0–36.0)
Platelets: 160 10*3/uL (ref 150–400)
RBC: 4.16 MIL/uL — ABNORMAL LOW (ref 4.22–5.81)

## 2012-05-16 ENCOUNTER — Other Ambulatory Visit: Payer: Self-pay | Admitting: *Deleted

## 2012-05-16 DIAGNOSIS — N289 Disorder of kidney and ureter, unspecified: Secondary | ICD-10-CM

## 2012-05-28 ENCOUNTER — Encounter: Payer: Self-pay | Admitting: Cardiology

## 2012-05-28 ENCOUNTER — Ambulatory Visit (INDEPENDENT_AMBULATORY_CARE_PROVIDER_SITE_OTHER): Payer: Medicare Other | Admitting: Cardiology

## 2012-05-28 VITALS — BP 90/70 | HR 89 | Ht 66.0 in | Wt 135.0 lb

## 2012-05-28 DIAGNOSIS — N189 Chronic kidney disease, unspecified: Secondary | ICD-10-CM

## 2012-05-28 DIAGNOSIS — Z7901 Long term (current) use of anticoagulants: Secondary | ICD-10-CM

## 2012-05-28 DIAGNOSIS — I4891 Unspecified atrial fibrillation: Secondary | ICD-10-CM

## 2012-05-28 DIAGNOSIS — Z0189 Encounter for other specified special examinations: Secondary | ICD-10-CM

## 2012-05-28 DIAGNOSIS — R609 Edema, unspecified: Secondary | ICD-10-CM

## 2012-05-28 MED ORDER — DIGOXIN 125 MCG PO TABS: ORAL_TABLET | ORAL | Status: DC

## 2012-05-28 NOTE — Progress Notes (Deleted)
Name: Troy Hinton    DOB: 1917/03/18  Age: 78 y.o.  MR#: [id number]       PCP:  Fredirick Maudlin, MD      Insurance: @PAYORNAME @   CC:    Chief Complaint  Patient presents with  . Atrial fibrillation    No complaints/  Med list/ TC    VS BP 90/70  Pulse 89  Ht 5\' 6"  (1.676 m)  Wt 135 lb (61.236 kg)  BMI 21.79 kg/m2  SpO2 97%  Weights Current Weight  05/28/12 135 lb (61.236 kg)  03/27/12 142 lb (64.411 kg)  01/02/12 141 lb 4.8 oz (64.093 kg)    Blood Pressure  BP Readings from Last 3 Encounters:  05/28/12 90/70  04/10/12 92/70  04/04/12 124/73     Admit date:  (Not on file) Last encounter with RMR:  05/11/2012   Allergy No Known Allergies  Current Outpatient Prescriptions  Medication Sig Dispense Refill  . Blood Pressure Monitoring (BLOOD PRESSURE MONITOR/M CUFF) MISC 1 Device by Does not apply route once.  1 each  0  . diltiazem (CARDIZEM CD) 240 MG 24 hr capsule Take 1 capsule (240 mg total) by mouth daily.  30 capsule  12  . Docusate Calcium (STOOL SOFTENER PO) Take 200 mg by mouth daily.       . furosemide (LASIX) 40 MG tablet Take 40 mg by mouth 2 (two) times daily.      Marland Kitchen HYDROcodone-acetaminophen (NORCO) 7.5-325 MG per tablet Take 1 tablet by mouth every 6 (six) hours as needed.      . megestrol (MEGACE) 40 MG tablet       . metoprolol tartrate (LOPRESSOR) 25 MG tablet Take 1 tablet (25 mg total) by mouth 2 (two) times daily.  60 tablet  12  . nitroGLYCERIN (NITROSTAT) 0.4 MG SL tablet Place 0.4 mg under the tongue every 5 (five) minutes as needed.        . pantoprazole (PROTONIX) 40 MG tablet Take 1 tablet (40 mg total) by mouth daily.  30 tablet  5  . PRADAXA 75 MG CAPS TAKE 1 CAPSULE BY MOUTH EVERY 12 HOURS.  60 capsule  6  . zolpidem (AMBIEN) 5 MG tablet Take 5 mg by mouth at bedtime as needed.        Discontinued Meds:   There are no discontinued medications.  Patient Active Problem List  Diagnosis  . Atrial fibrillation  . Abdominal aortic  aneurysm  . Aortic stenosis  . Chronic anticoagulation  . Chronic kidney disease  . Edema  . Anorexia    LABS Orders Only on 05/11/2012  Component Date Value  . WBC 05/11/2012 6.0   . RBC 05/11/2012 4.16*  . Hemoglobin 05/11/2012 14.3   . HCT 05/11/2012 40.3   . MCV 05/11/2012 96.9   . [facility] 05/11/2012 34.4*  . MCHC 05/11/2012 35.5   . RDW 05/11/2012 12.9   . Platelets 05/11/2012 160   . Sodium 05/11/2012 140   . Potassium 05/11/2012 4.0   . Chloride 05/11/2012 100   . CO2 05/11/2012 31   . Glucose, Bld 05/11/2012 92   . BUN 05/11/2012 37*  . Creat 05/11/2012 1.67*  . Total Bilirubin 05/11/2012 0.5   . Alkaline Phosphatase 05/11/2012 73   . AST 05/11/2012 18   . ALT 05/11/2012 20   . Total Protein 05/11/2012 5.8*  . Albumin 05/11/2012 4.0   . Calcium 05/11/2012 9.1   Orders Only on 04/12/2012  Component Date Value  .  WBC 04/16/2012 7.5   . RBC 04/16/2012 4.25   . Hemoglobin 04/16/2012 14.4   . HCT 04/16/2012 41.9   . MCV 04/16/2012 98.6   . [facility] 04/16/2012 33.9   . MCHC 04/16/2012 34.4   . RDW 04/16/2012 13.9   . Platelets 04/16/2012 179   . Sodium 04/16/2012 143   . Potassium 04/16/2012 4.2   . Chloride 04/16/2012 105   . CO2 04/16/2012 27   . Glucose, Bld 04/16/2012 89   . BUN 04/16/2012 40*  . Creat 04/16/2012 1.90*  . Total Bilirubin 04/16/2012 0.6   . Alkaline Phosphatase 04/16/2012 68   . AST 04/16/2012 23   . ALT 04/16/2012 19   . Total Protein 04/16/2012 6.1   . Albumin 04/16/2012 3.8   . Calcium 04/16/2012 9.1      Results for this Opt Visit:     Results for orders placed in visit on 05/11/12  CBC      Component Value Range   WBC 6.0  4.0 - 10.5 K/uL   RBC 4.16 (*) 4.22 - 5.81 MIL/uL   Hemoglobin 14.3  13.0 - 17.0 g/dL   HCT 46.9  62.9 - 52.8 %   MCV 96.9  78.0 - 100.0 fL   MCH 34.4 (*) 26.0 - 34.0 pg   MCHC 35.5  30.0 - 36.0 g/dL   RDW 41.3  24.4 - 01.0 %   Platelets 160  150 - 400 K/uL  COMPREHENSIVE METABOLIC PANEL      Component  Value Range   Sodium 140  135 - 145 mEq/L   Potassium 4.0  3.5 - 5.3 mEq/L   Chloride 100  96 - 112 mEq/L   CO2 31  19 - 32 mEq/L   Glucose, Bld 92  70 - 99 mg/dL   BUN 37 (*) 6 - 23 mg/dL   Creat 2.72 (*) 5.36 - 1.35 mg/dL   Total Bilirubin 0.5  0.3 - 1.2 mg/dL   Alkaline Phosphatase 73  39 - 117 U/L   AST 18  0 - 37 U/L   ALT 20  0 - 53 U/L   Total Protein 5.8 (*) 6.0 - 8.3 g/dL   Albumin 4.0  3.5 - 5.2 g/dL   Calcium 9.1  8.4 - 64.4 mg/dL    EKG Orders placed in visit on 03/27/12  . EKG 12-LEAD     Prior Assessment and Plan Problem List as of 05/28/2012            Cardiology Problems   Atrial fibrillation   Last Assessment & Plan Note   03/27/2012 Office Visit Signed 03/27/2012  5:59 PM by Kathlen Brunswick, MD    Heart rate is moderately elevated despite substitution of 180 mg of diltiazem for 120 mg of verapamil.  Generally, these agents should be about equipotent.  Nonetheless, additional medication will be required.  Dose of diltiazem will be increased to 240 mg per day and heart rate reassessed in one week.  Sphygmomanometer used at home may not be providing an adequate determination of ventricular rate.  We will consider the addition of metoprolol if diltiazem fails to provide heart rate control.    Abdominal aortic aneurysm   Last Assessment & Plan Note   11/28/2011 Office Visit Signed 11/28/2011  2:22 PM by Kathlen Brunswick, MD    Recent abdominal ultrasound shows no change in aneurysm.  It appears that this will never be a significant clinical problem for this elderly gentleman.  Aortic stenosis   Last Assessment & Plan Note   03/27/2012 Office Visit Signed 03/27/2012  6:00 PM by Kathlen Brunswick, MD    Aortic stenosis was said to be moderate on echocardiography 16 months ago, but appears to be mild by physical examination.  The absence of symptoms despite a heart rate of 136 provide further reassurance that stenosis is not hemodynamically significant.      Other     Chronic anticoagulation   Last Assessment & Plan Note   03/27/2012 Office Visit Signed 03/27/2012  6:03 PM by Kathlen Brunswick, MD    Patient has experienced no complications or other difficulties and treatment with this novel oral anticoagulant, which will be continued.    Chronic kidney disease   Last Assessment & Plan Note   09/27/2011 Office Visit Signed 09/27/2011  6:47 PM by Kathlen Brunswick, MD    Moderate renal dysfunction with adequate GFR to allow use of dabigatran.    Edema   Last Assessment & Plan Note   03/27/2012 Office Visit Signed 03/27/2012  6:05 PM by Kathlen Brunswick, MD    Edema is more prominent at this visit.  Compliance with low salt diet and leg elevation is questionable.  He is not wearing his compression stockings.  Furosemide will be given in divided doses in an attempt to enhance efficacy, while importance of above non-pharmaceutical measures was emphasized.  In the absence of discomfort, skin compromise or other adverse effects, edema appears to be relatively benign.    Anorexia   Last Assessment & Plan Note   03/27/2012 Office Visit Signed 03/27/2012  6:02 PM by Kathlen Brunswick, MD    Weight loss has stabilized after a reduction of 15 pounds over the past twelve months.        Imaging: No results found.   FRS Calculation: Score not calculated

## 2012-05-28 NOTE — Patient Instructions (Addendum)
Your physician recommends that you schedule a follow-up appointment in: 1 month  Your physician has recommended you make the following change in your medication:  1 - START Digoxin 0.125 mg every day except NONE on Sat or Sun

## 2012-05-29 ENCOUNTER — Encounter: Payer: Self-pay | Admitting: Cardiology

## 2012-05-29 DIAGNOSIS — Z0189 Encounter for other specified special examinations: Secondary | ICD-10-CM | POA: Insufficient documentation

## 2012-05-29 NOTE — Assessment & Plan Note (Addendum)
6 minute walk performed.  Patient elected not to use his cane, but covered 600 feet with oxygen saturation of 94% standing at rest and 88% after exercise.  This is a relatively good performance for 78 year old.  Troy Hinton remains essentially asymptomatic despite atrial fibrillation with intermittently rapid ventricular response and borderline hypotension.  Digoxin will be added at low dose in an attempt to adequately control heart rate while permitting a decrease in the dose of beta blocker, calcium channel antagonist or both in order to allow blood pressure to increase.  I will continue to evaluate Troy Hinton frequently until his immediate issues are adequately managed.

## 2012-05-29 NOTE — Assessment & Plan Note (Signed)
Likely borderline prerenal with elevated BUN and creatinine of 37 and 1.67 respectively.  Renal function has improved from our last assessment and is currently at an acceptable level with good control of patient's fluid status.  Current diuretics will be continued.

## 2012-05-29 NOTE — Progress Notes (Signed)
Patient ID: Troy Hinton, male   DOB: 1917-07-22, 78 y.o.   MRN: [id number]  HPI: Scheduled return visit for continued assessment and treatment of atrial fibrillation.  Heart rate remains fairly rapid at close to 100 bpm, but is substantially improved from his last visit.  He continues to report good exercise tolerance and not much in the way of symptoms.  He has developed no new medical problems nor has he required urgent medical care.  Prior to Admission medications   Medication Sig Start Date End Date Taking? Authorizing Provider  Blood Pressure Monitoring (BLOOD PRESSURE MONITOR/M CUFF) MISC 1 Device by Does not apply route once. 04/04/12  Yes Kathlen Brunswick, MD  diltiazem (CARDIZEM CD) 240 MG 24 hr capsule Take 1 capsule (240 mg total) by mouth daily. 03/27/12 03/27/13 Yes Kathlen Brunswick, MD  Docusate Calcium (STOOL SOFTENER PO) Take 200 mg by mouth daily.    Yes Historical Provider, MD  furosemide (LASIX) 40 MG tablet Take 40 mg by mouth 2 (two) times daily.   Yes Historical Provider, MD  HYDROcodone-acetaminophen (NORCO) 7.5-325 MG per tablet Take 1 tablet by mouth every 6 (six) hours as needed.   Yes Historical Provider, MD  megestrol (MEGACE) 40 MG tablet  05/18/12  Yes Historical Provider, MD  metoprolol tartrate (LOPRESSOR) 25 MG tablet Take 1 tablet (25 mg total) by mouth 2 (two) times daily. 04/04/12 04/04/13 Yes Kathlen Brunswick, MD  nitroGLYCERIN (NITROSTAT) 0.4 MG SL tablet Place 0.4 mg under the tongue every 5 (five) minutes as needed.     Yes Historical Provider, MD  pantoprazole (PROTONIX) 40 MG tablet Take 1 tablet (40 mg total) by mouth daily. 11/01/11 10/31/12 Yes Malissa Hippo, MD  PRADAXA 75 MG CAPS TAKE 1 CAPSULE BY MOUTH EVERY 12 HOURS. 09/14/11  Yes Kathlen Brunswick, MD  zolpidem (AMBIEN) 5 MG tablet Take 5 mg by mouth at bedtime as needed. 05/24/11  Yes Kathlen Brunswick, MD  digoxin (LANOXIN) 0.125 MG tablet Take 1 tablet Monday - Friday (None on Sat or Sun) 05/28/12    Kathlen Brunswick, MD   No Known Allergies    Past medical history, social history, and family history reviewed and updated.  ROS: Denies orthopnea, PND, palpitations, lightheadedness or syncope.  All other systems reviewed and are negative.  PHYSICAL EXAM: BP 90/70  Pulse 89  Ht 5\' 6"  (1.676 m)  Wt 61.236 kg (135 lb)  BMI 21.79 kg/m2  SpO2 97% ; repeat blood pressure 90/60 General-Well developed; no acute distress Body habitus-proportionate weight and height Neck-No JVD; no carotid bruits Lungs-clear lung fields; resonant to percussion; mildly decreased breath sounds at the right base Cardiovascular-normal PMI; normal S1 and S2; grade 3/6 mid-peaking systolic ejection murmur at the cardiac base with a decrease, but not absent, aortic component second heart sound. Abdomen-normal bowel sounds; soft and non-tender without masses or organomegaly Musculoskeletal-No deformities, no cyanosis or clubbing Neurologic-Normal cranial nerves; symmetric strength and tone Skin-Warm, no significant lesions Extremities-distal pulses intact; 1-2+ edema  Rhythm Strip: Atrial fibrillation; ventricular rate of 96 bpm  ASSESSMENT AND PLAN:  [name], MD 05/29/2012 3:22 PM

## 2012-05-29 NOTE — Assessment & Plan Note (Signed)
Not ideally controlled, but diuretics have already resulted in some exacerbation of chronic kidney disease.  Current level of edema is tolerable both to me and to the patient.

## 2012-06-04 ENCOUNTER — Other Ambulatory Visit: Payer: Self-pay | Admitting: Cardiology

## 2012-06-19 ENCOUNTER — Telehealth: Payer: Self-pay | Admitting: Cardiology

## 2012-06-19 ENCOUNTER — Encounter: Payer: Self-pay | Admitting: Cardiology

## 2012-06-19 DIAGNOSIS — I4891 Unspecified atrial fibrillation: Secondary | ICD-10-CM

## 2012-06-19 NOTE — Telephone Encounter (Signed)
Patient's daughter states that she brought by list of BP readings on Friday 06/15/12.  States that she hasn't heard anything back regarding them and wants a phone call today.  I could not find the readings in Media list but states that she gave them to [place]. / tg

## 2012-06-19 NOTE — Telephone Encounter (Signed)
BP readings at home have been 70s - 90s systolic and 40s -50s diastolic.  HR 60s.  Scanned in to chart for your review.  Daughter concerned, however has not been symptomatic.

## 2012-06-20 ENCOUNTER — Other Ambulatory Visit: Payer: Self-pay | Admitting: *Deleted

## 2012-06-20 MED ORDER — DILTIAZEM HCL ER COATED BEADS 180 MG PO CP24: 180.0000 mg | ORAL_CAPSULE | Freq: Every day | ORAL | Status: DC

## 2012-06-20 NOTE — Telephone Encounter (Signed)
Blood pressures reviewed.  Systolics 66-97 with 3 of 18 readings recording systolics of less than 80 mmHg.  BP determinations with the patient in atrial fibrillation may not be entirely accurate.  Decrease diltiazem to 180 mg per day.  Blood pressure check with rhythm strip and CMet in 2 weeks.

## 2012-06-20 NOTE — Telephone Encounter (Signed)
Daughter, Aram Beecham advised to decrease diltiazem to 180.  New script sent to Pharmacy.  Has an appointment with Dr Dietrich Pates on 8/15 and we will address BP and rhythm at that time.

## 2012-06-28 ENCOUNTER — Ambulatory Visit (INDEPENDENT_AMBULATORY_CARE_PROVIDER_SITE_OTHER): Payer: Medicare Other | Admitting: Cardiology

## 2012-06-28 ENCOUNTER — Encounter: Payer: Self-pay | Admitting: Cardiology

## 2012-06-28 VITALS — BP 91/61 | HR 71 | Ht 66.0 in | Wt 137.0 lb

## 2012-06-28 DIAGNOSIS — Z7901 Long term (current) use of anticoagulants: Secondary | ICD-10-CM

## 2012-06-28 DIAGNOSIS — I4891 Unspecified atrial fibrillation: Secondary | ICD-10-CM

## 2012-06-28 DIAGNOSIS — R609 Edema, unspecified: Secondary | ICD-10-CM

## 2012-06-28 DIAGNOSIS — N189 Chronic kidney disease, unspecified: Secondary | ICD-10-CM

## 2012-06-28 DIAGNOSIS — Z0189 Encounter for other specified special examinations: Secondary | ICD-10-CM

## 2012-06-28 MED ORDER — METOPROLOL TARTRATE 25 MG PO TABS: 12.5000 mg | ORAL_TABLET | Freq: Two times a day (BID) | ORAL | Status: DC

## 2012-06-28 NOTE — Progress Notes (Deleted)
Name: Troy Hinton    DOB: [birth year]  Age: 78 y.o.  MR#: [id number]       PCP:  Troy Maudlin, MD      Insurance: @PAYORNAME @   CC:   No chief complaint on file.   VS BP 88/50  Pulse 72  Ht 5\' 6"  (1.676 m)  Wt 137 lb (62.143 kg)  BMI 22.11 kg/m2  Weights Current Weight  06/28/12 137 lb (62.143 kg)  05/28/12 135 lb (61.236 kg)  03/27/12 142 lb (64.411 kg)    Blood Pressure  BP Readings from Last 3 Encounters:  06/28/12 88/50  05/28/12 90/70  04/10/12 92/70     Admit date:  (Not on file) Last encounter with RMR:  06/19/2012   Allergy No Known Allergies  Current Outpatient Prescriptions  Medication Sig Dispense Refill  . Blood Pressure Monitoring (BLOOD PRESSURE MONITOR/M CUFF) MISC 1 Device by Does not apply route once.  1 each  0  . digoxin (LANOXIN) 0.125 MG tablet Take 1 tablet Monday - Friday (None on Sat or Sun)  25 tablet  12  . diltiazem (CARDIZEM CD) 180 MG 24 hr capsule Take 1 capsule (180 mg total) by mouth daily.  30 capsule  12  . Docusate Calcium (STOOL SOFTENER PO) Take 200 mg by mouth daily.       . fluticasone (CUTIVATE) 0.05 % cream Apply 1 application topically 4 (four) times daily.       . furosemide (LASIX) 40 MG tablet Take 40 mg by mouth 2 (two) times daily.      Marland Kitchen HYDROcodone-acetaminophen (NORCO) 7.5-325 MG per tablet Take 1 tablet by mouth every 6 (six) hours as needed.      . megestrol (MEGACE) 40 MG tablet Take 40 mg by mouth daily.       . metoprolol tartrate (LOPRESSOR) 25 MG tablet Take 1 tablet (25 mg total) by mouth 2 (two) times daily.  60 tablet  12  . nitroGLYCERIN (NITROSTAT) 0.4 MG SL tablet Place 0.4 mg under the tongue every 5 (five) minutes as needed.        Marland Kitchen PRADAXA 75 MG CAPS TAKE 1 CAPSULE BY MOUTH EVERY 12 HOURS.  60 capsule  12  . zolpidem (AMBIEN) 5 MG tablet Take 5 mg by mouth at bedtime as needed.        Discontinued Meds:    Medications Discontinued During This Encounter  Medication Reason  . pantoprazole  (PROTONIX) 40 MG tablet Error    Patient Active Problem List  Diagnosis  . Atrial fibrillation  . Abdominal aortic aneurysm  . Aortic stenosis  . Chronic anticoagulation  . Chronic kidney disease  . Edema  . Anorexia  . Laboratory test    LABS Orders Only on 05/11/2012  Component Date Value  . WBC 05/11/2012 6.0   . RBC 05/11/2012 4.16*  . Hemoglobin 05/11/2012 14.3   . HCT 05/11/2012 40.3   . MCV 05/11/2012 96.9   . [facility] 05/11/2012 34.4*  . MCHC 05/11/2012 35.5   . RDW 05/11/2012 12.9   . Platelets 05/11/2012 160   . Sodium 05/11/2012 140   . Potassium 05/11/2012 4.0   . Chloride 05/11/2012 100   . CO2 05/11/2012 31   . Glucose, Bld 05/11/2012 92   . BUN 05/11/2012 37*  . Creat 05/11/2012 1.67*  . Total Bilirubin 05/11/2012 0.5   . Alkaline Phosphatase 05/11/2012 73   . AST 05/11/2012 18   . ALT 05/11/2012 20   .  Total Protein 05/11/2012 5.8*  . Albumin 05/11/2012 4.0   . Calcium 05/11/2012 9.1   Orders Only on 04/12/2012  Component Date Value  . WBC 04/16/2012 7.5   . RBC 04/16/2012 4.25   . Hemoglobin 04/16/2012 14.4   . HCT 04/16/2012 41.9   . MCV 04/16/2012 98.6   . [facility] 04/16/2012 33.9   . MCHC 04/16/2012 34.4   . RDW 04/16/2012 13.9   . Platelets 04/16/2012 179   . Sodium 04/16/2012 143   . Potassium 04/16/2012 4.2   . Chloride 04/16/2012 105   . CO2 04/16/2012 27   . Glucose, Bld 04/16/2012 89   . BUN 04/16/2012 40*  . Creat 04/16/2012 1.90*  . Total Bilirubin 04/16/2012 0.6   . Alkaline Phosphatase 04/16/2012 68   . AST 04/16/2012 23   . ALT 04/16/2012 19   . Total Protein 04/16/2012 6.1   . Albumin 04/16/2012 3.8   . Calcium 04/16/2012 9.1      Results for this Opt Visit:     Results for orders placed in visit on 05/11/12  CBC      Component Value Range   WBC 6.0  4.0 - 10.5 K/uL   RBC 4.16 (*) 4.22 - 5.81 MIL/uL   Hemoglobin 14.3  13.0 - 17.0 g/dL   HCT 40.9  81.1 - 91.4 %   MCV 96.9  78.0 - 100.0 fL   MCH 34.4 (*) 26.0 - 34.0 pg     MCHC 35.5  30.0 - 36.0 g/dL   RDW 78.2  95.6 - 21.3 %   Platelets 160  150 - 400 K/uL  COMPREHENSIVE METABOLIC PANEL      Component Value Range   Sodium 140  135 - 145 mEq/L   Potassium 4.0  3.5 - 5.3 mEq/L   Chloride 100  96 - 112 mEq/L   CO2 31  19 - 32 mEq/L   Glucose, Bld 92  70 - 99 mg/dL   BUN 37 (*) 6 - 23 mg/dL   Creat 0.86 (*) 5.78 - 1.35 mg/dL   Total Bilirubin 0.5  0.3 - 1.2 mg/dL   Alkaline Phosphatase 73  39 - 117 U/L   AST 18  0 - 37 U/L   ALT 20  0 - 53 U/L   Total Protein 5.8 (*) 6.0 - 8.3 g/dL   Albumin 4.0  3.5 - 5.2 g/dL   Calcium 9.1  8.4 - 46.9 mg/dL    EKG Orders placed in visit on 03/27/12  . EKG 12-LEAD     Prior Assessment and Plan Problem List as of 06/28/2012            Cardiology Problems   Atrial fibrillation   Last Assessment & Plan Note   05/28/2012 Office Visit Addendum 05/31/2012  8:22 PM by Kathlen Brunswick, MD    6 minute walk performed.  Patient elected not to use his cane, but covered 600 feet with oxygen saturation of 94% standing at rest and 88% after exercise.  This is a relatively good performance for 78 year old.  He remains essentially asymptomatic despite atrial fibrillation with intermittently rapid ventricular response and borderline hypotension.  Digoxin will be added at low dose in an attempt to adequately control heart rate while permitting a decrease in the dose of beta blocker, calcium channel antagonist or both in order to allow blood pressure to increase.  I will continue to evaluate [name] frequently until his immediate issues are adequately managed.  Abdominal aortic aneurysm   Last Assessment & Plan Note   11/28/2011 Office Visit Signed 11/28/2011  2:22 PM by Kathlen Brunswick, MD    Recent abdominal ultrasound shows no change in aneurysm.  It appears that this will never be a significant clinical problem for this elderly gentleman.    Aortic stenosis   Last Assessment & Plan Note   03/27/2012 Office Visit Signed  03/27/2012  6:00 PM by Kathlen Brunswick, MD    Aortic stenosis was said to be moderate on echocardiography 16 months ago, but appears to be mild by physical examination.  The absence of symptoms despite a heart rate of 136 provide further reassurance that stenosis is not hemodynamically significant.      Other   Chronic anticoagulation   Last Assessment & Plan Note   03/27/2012 Office Visit Signed 03/27/2012  6:03 PM by Kathlen Brunswick, MD    Patient has experienced no complications or other difficulties and treatment with this novel oral anticoagulant, which will be continued.    Chronic kidney disease   Last Assessment & Plan Note   05/28/2012 Office Visit Signed 05/29/2012  3:32 PM by Kathlen Brunswick, MD    Likely borderline prerenal with elevated BUN and creatinine of 37 and 1.67 respectively.  Renal function has improved from our last assessment and is currently at an acceptable level with good control of patient's fluid status.  Current diuretics will be continued.    Edema   Last Assessment & Plan Note   05/28/2012 Office Visit Signed 05/29/2012  3:32 PM by Kathlen Brunswick, MD    Not ideally controlled, but diuretics have already resulted in some exacerbation of chronic kidney disease.  Current level of edema is tolerable both to me and to the patient.    Anorexia   Last Assessment & Plan Note   03/27/2012 Office Visit Signed 03/27/2012  6:02 PM by Kathlen Brunswick, MD    Weight loss has stabilized after a reduction of 15 pounds over the past twelve months.    Laboratory test       Imaging: No results found.   FRS Calculation: Score not calculated

## 2012-06-28 NOTE — Assessment & Plan Note (Signed)
No apparent complications of anticoagulation with pradaxa at low-dose.  Renal function will continue to be monitored and this drug discontinued should creatinine clearance fall below 30 cc per minute.

## 2012-06-28 NOTE — Assessment & Plan Note (Signed)
Heart rate is now well controlled.  With continued borderline blood pressures, metoprolol dosage will be decreased by 50%.  CNA's and patient's family will monitor blood pressure and heart rate at home.  I explained that measurements with an automated cuff may be somewhat inaccurate as the result of atrial fibrillation but that sustained tachycardia or hypotension should be reported to [place].

## 2012-06-28 NOTE — Progress Notes (Signed)
Patient ID: Troy Hinton, male   DOB: [birth year], 78 y.o.   MRN: [id number]  HPI: Scheduled return visit for this delightful elderly gentleman with severe aortic stenosis, atrial fibrillation, borderline hypotension and chronic lung disease.  His history is suspect since he does not necessarily recall symptoms that have occurred, even in the recent past.  His daughter reports limited activity due to chronic back problems.  He has had no dyspnea nor chest discomfort.  He may have had some lightheadedness, but no syncope or falls.  Over the past week, he has had issues with constipation and then rectal discomfort following use of a suppository.  These symptoms appear to be fading.  Prior to Admission medications   Medication Sig Start Date End Date Taking? Authorizing Provider  Blood Pressure Monitoring (BLOOD PRESSURE MONITOR/M CUFF) MISC 1 Device by Does not apply route once. 04/04/12  Yes Kathlen Brunswick, MD  digoxin (LANOXIN) 0.125 MG tablet Take 1 tablet Monday - Friday (None on Sat or Sun) 05/28/12  Yes Kathlen Brunswick, MD  diltiazem (CARDIZEM CD) 180 MG 24 hr capsule Take 1 capsule (180 mg total) by mouth daily. 06/20/12 06/20/13 Yes Kathlen Brunswick, MD  Docusate Calcium (STOOL SOFTENER PO) Take 200 mg by mouth daily.    Yes Historical Provider, MD  fluticasone (CUTIVATE) 0.05 % cream Apply 1 application topically 4 (four) times daily.  06/04/12  Yes Historical Provider, MD  furosemide (LASIX) 40 MG tablet Take 40 mg by mouth 2 (two) times daily.   Yes Historical Provider, MD  HYDROcodone-acetaminophen (NORCO) 7.5-325 MG per tablet Take 1 tablet by mouth every 6 (six) hours as needed.   Yes Historical Provider, MD  megestrol (MEGACE) 40 MG tablet Take 40 mg by mouth daily.  05/18/12  Yes Historical Provider, MD  metoprolol tartrate (LOPRESSOR) 25 MG tablet Take 1 tablet (25 mg total) by mouth 2 (two) times daily. 04/04/12 04/04/13 Yes Kathlen Brunswick, MD  nitroGLYCERIN (NITROSTAT) 0.4 MG SL tablet  Place 0.4 mg under the tongue every 5 (five) minutes as needed.     Yes Historical Provider, MD  PRADAXA 75 MG CAPS TAKE 1 CAPSULE BY MOUTH EVERY 12 HOURS. 06/04/12  Yes Kathlen Brunswick, MD  zolpidem (AMBIEN) 5 MG tablet Take 5 mg by mouth at bedtime as needed. 05/24/11  Yes Kathlen Brunswick, MD   No Known Allergies    Past medical history, social history, and family history reviewed and updated.  ROS: Denies orthopnea or PND.  Moderate chronic edema is unchanged.  Appetite has been fairly good until the past week with stable weight.  All other systems reviewed and are negative  PHYSICAL EXAM: BP 91/61  Pulse 71  Ht 5\' 6"  (1.676 m)  Wt 62.143 kg (137 lb)  BMI 22.11 kg/m2  General-Well developed; no acute distress Body habitus-Slight Neck-No JVD Lungs-clear lung fields; resonant to percussion; kyphosis present Cardiovascular-normal PMI; normal S1 and absent A2; grade 2-3/6 mid-peaking systolic ejection murmur at the cardiac base Abdomen-normal bowel sounds; soft and non-tender without masses or organomegaly Musculoskeletal-No deformities, no cyanosis or clubbing Neurologic-Normal cranial nerves; symmetric strength and tone Skin-Warm, no significant lesions Extremities-distal pulses intact; 1+ edema to the lower tibia  Rhythm Strip: Atrial fibrillation with a controlled ventricular response of 80 bpm  ASSESSMENT AND PLAN:  Anthon Bing, MD 06/28/2012 2:18 PM

## 2012-06-28 NOTE — Patient Instructions (Addendum)
Your physician recommends that you schedule a follow-up appointment in: 2 months  Your physician has recommended you make the following change in your medication:  1 - DECREASE Metoprolol to 12.5 mg twice a day  Call for blood pressure consistently <80 systolic (top number) or Heart rate > 100

## 2012-06-28 NOTE — Assessment & Plan Note (Signed)
Edema is stable, and CHF appears to be controlled.  Current dose of furosemide appears adequate at present.  He developed significant dehydration and exacerbation of chronic kidney disease with a higher dose in the past.

## 2012-09-04 ENCOUNTER — Ambulatory Visit: Payer: Medicare Other | Admitting: Cardiology

## 2012-09-07 ENCOUNTER — Encounter: Payer: Self-pay | Admitting: Cardiology

## 2012-09-07 ENCOUNTER — Ambulatory Visit (INDEPENDENT_AMBULATORY_CARE_PROVIDER_SITE_OTHER): Payer: Medicare Other | Admitting: Cardiology

## 2012-09-07 VITALS — BP 116/69 | HR 75 | Ht 66.0 in | Wt 134.1 lb

## 2012-09-07 DIAGNOSIS — R609 Edema, unspecified: Secondary | ICD-10-CM

## 2012-09-07 DIAGNOSIS — N189 Chronic kidney disease, unspecified: Secondary | ICD-10-CM

## 2012-09-07 DIAGNOSIS — I350 Nonrheumatic aortic (valve) stenosis: Secondary | ICD-10-CM

## 2012-09-07 DIAGNOSIS — I359 Nonrheumatic aortic valve disorder, unspecified: Secondary | ICD-10-CM

## 2012-09-07 DIAGNOSIS — I4891 Unspecified atrial fibrillation: Secondary | ICD-10-CM

## 2012-09-07 DIAGNOSIS — R630 Anorexia: Secondary | ICD-10-CM

## 2012-09-07 NOTE — Patient Instructions (Addendum)
Your physician recommends that you schedule a follow-up appointment in: 10 weeks  Your physician recommends that you return for lab work in: Within the week

## 2012-09-07 NOTE — Progress Notes (Deleted)
Name: Troy Hinton    DOB: 1917/04/02  Age: 78 y.o.  MR#: [id number]       PCP:  Fredirick Maudlin, MD      Insurance: @PAYORNAME @   CC:    Chief Complaint  Patient presents with  . Follow-up    More frequent shortness of breath while standing    VS BP 116/69  Pulse 75  Ht 5\' 6"  (1.676 m)  Wt 134 lb 1.3 oz (60.818 kg)  BMI 21.64 kg/m2  SpO2 98%  Weights Current Weight  09/07/12 134 lb 1.3 oz (60.818 kg)  06/28/12 137 lb (62.143 kg)  05/28/12 135 lb (61.236 kg)    Blood Pressure  BP Readings from Last 3 Encounters:  09/07/12 116/69  06/28/12 91/61  05/28/12 90/70     Admit date:  (Not on file) Last encounter with RMR:  06/28/2012   Allergy No Known Allergies  Current Outpatient Prescriptions  Medication Sig Dispense Refill  . Blood Pressure Monitoring (BLOOD PRESSURE MONITOR/M CUFF) MISC 1 Device by Does not apply route once.  1 each  0  . digoxin (LANOXIN) 0.125 MG tablet Take 25 mg by mouth daily. Take 1 tablet Monday - Friday (None on Sat or Sun)      . diltiazem (CARDIZEM CD) 180 MG 24 hr capsule Take 240 mg by mouth daily.      Tery Sanfilippo Calcium (STOOL SOFTENER PO) Take 200 mg by mouth 2 (two) times daily.       . furosemide (LASIX) 40 MG tablet Take 40 mg by mouth 2 (two) times daily.      Marland Kitchen HYDROcodone-acetaminophen (NORCO) 7.5-325 MG per tablet Take 1 tablet by mouth every 6 (six) hours as needed.      . megestrol (MEGACE) 40 MG tablet Take 40 mg by mouth daily.       . metoprolol tartrate (LOPRESSOR) 25 MG tablet Take 25 mg by mouth 2 (two) times daily.      . nitroGLYCERIN (NITROSTAT) 0.4 MG SL tablet Place 0.4 mg under the tongue every 5 (five) minutes as needed.        Marland Kitchen PRADAXA 75 MG CAPS TAKE 1 CAPSULE BY MOUTH EVERY 12 HOURS.  60 capsule  12  . zolpidem (AMBIEN) 5 MG tablet Take 5 mg by mouth at bedtime as needed.      Marland Kitchen DISCONTD: diltiazem (CARDIZEM CD) 180 MG 24 hr capsule Take 1 capsule (180 mg total) by mouth daily.  30 capsule  12  . DISCONTD:  metoprolol tartrate (LOPRESSOR) 25 MG tablet Take 0.5 tablets (12.5 mg total) by mouth 2 (two) times daily.  30 tablet  11    Discontinued Meds:    Medications Discontinued During This Encounter  Medication Reason  . metoprolol tartrate (LOPRESSOR) 25 MG tablet   . diltiazem (CARDIZEM CD) 180 MG 24 hr capsule   . digoxin (LANOXIN) 0.125 MG tablet   . fluticasone (CUTIVATE) 0.05 % cream Error    Patient Active Problem List  Diagnosis  . Atrial fibrillation  . Abdominal aortic aneurysm  . Aortic stenosis  . Chronic anticoagulation  . Chronic kidney disease  . Edema  . Anorexia  . Laboratory test    LABS No visits with results within 3 Month(s) from this visit. Latest known visit with results is:  Orders Only on 05/11/2012  Component Date Value  . WBC 05/11/2012 6.0   . RBC 05/11/2012 4.16*  . Hemoglobin 05/11/2012 14.3   . HCT 05/11/2012 40.3   .  MCV 05/11/2012 96.9   . [facility] 05/11/2012 34.4*  . MCHC 05/11/2012 35.5   . RDW 05/11/2012 12.9   . Platelets 05/11/2012 160   . Sodium 05/11/2012 140   . Potassium 05/11/2012 4.0   . Chloride 05/11/2012 100   . CO2 05/11/2012 31   . Glucose, Bld 05/11/2012 92   . BUN 05/11/2012 37*  . Creat 05/11/2012 1.67*  . Total Bilirubin 05/11/2012 0.5   . Alkaline Phosphatase 05/11/2012 73   . AST 05/11/2012 18   . ALT 05/11/2012 20   . Total Protein 05/11/2012 5.8*  . Albumin 05/11/2012 4.0   . Calcium 05/11/2012 9.1      Results for this Opt Visit:     Results for orders placed in visit on 05/11/12  CBC      Component Value Range   WBC 6.0  4.0 - 10.5 K/uL   RBC 4.16 (*) 4.22 - 5.81 MIL/uL   Hemoglobin 14.3  13.0 - 17.0 g/dL   HCT 16.1  09.6 - 04.5 %   MCV 96.9  78.0 - 100.0 fL   MCH 34.4 (*) 26.0 - 34.0 pg   MCHC 35.5  30.0 - 36.0 g/dL   RDW 40.9  81.1 - 91.4 %   Platelets 160  150 - 400 K/uL  COMPREHENSIVE METABOLIC PANEL      Component Value Range   Sodium 140  135 - 145 mEq/L   Potassium 4.0  3.5 - 5.3 mEq/L    Chloride 100  96 - 112 mEq/L   CO2 31  19 - 32 mEq/L   Glucose, Bld 92  70 - 99 mg/dL   BUN 37 (*) 6 - 23 mg/dL   Creat 7.82 (*) 9.56 - 1.35 mg/dL   Total Bilirubin 0.5  0.3 - 1.2 mg/dL   Alkaline Phosphatase 73  39 - 117 U/L   AST 18  0 - 37 U/L   ALT 20  0 - 53 U/L   Total Protein 5.8 (*) 6.0 - 8.3 g/dL   Albumin 4.0  3.5 - 5.2 g/dL   Calcium 9.1  8.4 - 21.3 mg/dL    EKG Orders placed in visit on 03/27/12  . EKG 12-LEAD     Prior Assessment and Plan Problem List as of 09/07/2012            Cardiology Problems   Atrial fibrillation   Last Assessment & Plan Note   06/28/2012 Office Visit Signed 06/28/2012  2:25 PM by Kathlen Brunswick, MD    Heart rate is now well controlled.  With continued borderline blood pressures, metoprolol dosage will be decreased by 50%.  CNA's and patient's family will monitor blood pressure and heart rate at home.  I explained that measurements with an automated cuff may be somewhat inaccurate as the result of atrial fibrillation but that sustained tachycardia or hypotension should be reported to [place].    Abdominal aortic aneurysm   Last Assessment & Plan Note   11/28/2011 Office Visit Signed 11/28/2011  2:22 PM by Kathlen Brunswick, MD    Recent abdominal ultrasound shows no change in aneurysm.  It appears that this will never be a significant clinical problem for this elderly gentleman.    Aortic stenosis   Last Assessment & Plan Note   03/27/2012 Office Visit Signed 03/27/2012  6:00 PM by Kathlen Brunswick, MD    Aortic stenosis was said to be moderate on echocardiography 16 months ago, but appears to be  mild by physical examination.  The absence of symptoms despite a heart rate of 136 provide further reassurance that stenosis is not hemodynamically significant.      Other   Chronic anticoagulation   Last Assessment & Plan Note   06/28/2012 Office Visit Signed 06/28/2012  2:24 PM by Kathlen Brunswick, MD    No apparent complications of anticoagulation  with pradaxa at low-dose.  Renal function will continue to be monitored and this drug discontinued should creatinine clearance fall below 30 cc per minute.    Chronic kidney disease   Last Assessment & Plan Note   05/28/2012 Office Visit Signed 05/29/2012  3:32 PM by Kathlen Brunswick, MD    Likely borderline prerenal with elevated BUN and creatinine of 37 and 1.67 respectively.  Renal function has improved from our last assessment and is currently at an acceptable level with good control of patient's fluid status.  Current diuretics will be continued.    Edema   Last Assessment & Plan Note   06/28/2012 Office Visit Signed 06/28/2012  2:23 PM by Kathlen Brunswick, MD    Edema is stable, and CHF appears to be controlled.  Current dose of furosemide appears adequate at present.  He developed significant dehydration and exacerbation of chronic kidney disease with a higher dose in the past.    Anorexia   Last Assessment & Plan Note   03/27/2012 Office Visit Signed 03/27/2012  6:02 PM by Kathlen Brunswick, MD    Weight loss has stabilized after a reduction of 15 pounds over the past twelve months.    Laboratory test       Imaging: No results found.   FRS Calculation: Score not calculated

## 2012-09-10 ENCOUNTER — Encounter: Payer: Self-pay | Admitting: Cardiology

## 2012-09-10 NOTE — Progress Notes (Signed)
Patient ID: Troy Hinton, male   DOB: 1916-11-24, 78 y.o.   MRN: [id number]  HPI: Scheduled return visit for this very nice gentleman with severe aortic stenosis and atrial fibrillation.  Since his last visit, he has done quite well with chronic class III dyspnea on exertion.  Prior to Admission medications   Medication Sig Start Date End Date Taking? Authorizing Provider  Blood Pressure Monitoring (BLOOD PRESSURE MONITOR/M CUFF) MISC 1 Device by Does not apply route once. 04/04/12  Yes Kathlen Brunswick, MD  digoxin (LANOXIN) 0.125 MG tablet Take 25 mg by mouth daily. Take 1 tablet Monday - Friday (None on Sat or Sun) 05/28/12  Yes Kathlen Brunswick, MD  diltiazem (CARDIZEM CD) 180 MG 24 hr capsule Take 240 mg by mouth daily. 06/20/12 06/20/13 Yes Kathlen Brunswick, MD  Docusate Calcium (STOOL SOFTENER PO) Take 200 mg by mouth 2 (two) times daily.    Yes Historical Provider, MD  furosemide (LASIX) 40 MG tablet Take 40 mg by mouth 2 (two) times daily.   Yes Historical Provider, MD  HYDROcodone-acetaminophen (NORCO) 7.5-325 MG per tablet Take 1 tablet by mouth every 6 (six) hours as needed.   Yes Historical Provider, MD  megestrol (MEGACE) 40 MG tablet Take 40 mg by mouth daily.  05/18/12  Yes Historical Provider, MD  metoprolol tartrate (LOPRESSOR) 25 MG tablet Take 25 mg by mouth 2 (two) times daily. 06/28/12 06/28/13 Yes Kathlen Brunswick, MD  nitroGLYCERIN (NITROSTAT) 0.4 MG SL tablet Place 0.4 mg under the tongue every 5 (five) minutes as needed.     Yes Historical Provider, MD  PRADAXA 75 MG CAPS TAKE 1 CAPSULE BY MOUTH EVERY 12 HOURS. 06/04/12  Yes Kathlen Brunswick, MD  zolpidem (AMBIEN) 5 MG tablet Take 5 mg by mouth at bedtime as needed. 05/24/11  Yes Kathlen Brunswick, MD   No Known Allergies    Past medical history, social history, and family history reviewed and updated.  ROS: Denies orthopnea, PND, lightheadedness, palpitations or syncope.  All the systems reviewed and are  negative.  PHYSICAL EXAM: BP 116/69  Pulse 75  Ht 5\' 6"  (1.676 m)  Wt 60.818 kg (134 lb 1.3 oz)  BMI 21.64 kg/m2  SpO2 98%  General-Well developed; no acute distress Body habitus-proportionate weight and height Neck-No JVD; carotids-pulses parvus and tardus Lungs-clear lung fields; resonant to percussion; kyphosis Cardiovascular-normal PMI; normal S1 and S2; grade 3/6 wheezing systolic murmur best heard at the cardiac base; no definite radiation to the carotids Abdomen-normal bowel sounds; soft and non-tender without masses or organomegaly Musculoskeletal-No deformities, no cyanosis or clubbing Neurologic-Normal cranial nerves; symmetric strength and tone Skin-Warm, no significant lesions Extremities-distal pulses intact; 2+ edema  ASSESSMENT AND PLAN:  Newborn Bing, MD 09/10/2012 11:17 AM

## 2012-09-12 LAB — CBC
HCT: 40.9 % (ref 39.0–52.0)
MCH: 33.5 pg (ref 26.0–34.0)
MCV: 95.8 fL (ref 78.0–100.0)
RBC: 4.27 MIL/uL (ref 4.22–5.81)
WBC: 8.6 10*3/uL (ref 4.0–10.5)

## 2012-09-13 ENCOUNTER — Encounter: Payer: Self-pay | Admitting: Cardiology

## 2012-09-13 LAB — COMPREHENSIVE METABOLIC PANEL
AST: 15 U/L (ref 0–37)
Albumin: 3.6 g/dL (ref 3.5–5.2)
Alkaline Phosphatase: 71 U/L (ref 39–117)
BUN: 31 mg/dL — ABNORMAL HIGH (ref 6–23)
Glucose, Bld: 75 mg/dL (ref 70–99)
Potassium: 4.1 mEq/L (ref 3.5–5.3)
Sodium: 141 mEq/L (ref 135–145)
Total Bilirubin: 0.6 mg/dL (ref 0.3–1.2)

## 2012-09-13 NOTE — Assessment & Plan Note (Signed)
Renal function relatively stable over the past year.  We will continue to monitor electrolytes, BUN and creatinine

## 2012-09-13 NOTE — Assessment & Plan Note (Addendum)
Aortic stenosis is severe based upon physical examination, but patient's level of symptoms is acceptable.  Due to the patient's advanced age and multiple medical problems, more aggressive therapy will be deferred.  He is interested in and willing to undergo percutaneous intervention should that prove necessary.

## 2012-09-13 NOTE — Assessment & Plan Note (Signed)
Associated with mild nausea but no emesis.  Etiology of GI symptoms is uncertain.

## 2012-09-13 NOTE — Assessment & Plan Note (Signed)
Edema persists, but has not worsened.  Adequately controlled with current medical regime.

## 2012-09-17 ENCOUNTER — Telehealth (INDEPENDENT_AMBULATORY_CARE_PROVIDER_SITE_OTHER): Payer: Self-pay | Admitting: Internal Medicine

## 2012-09-17 NOTE — Telephone Encounter (Signed)
Patient states he is having diarrhea now. ?he may be confused. I have asked him to have his daughter call our office again so she can tell me what is going on.

## 2012-10-03 ENCOUNTER — Encounter (HOSPITAL_COMMUNITY): Payer: Self-pay | Admitting: *Deleted

## 2012-10-03 ENCOUNTER — Other Ambulatory Visit (HOSPITAL_COMMUNITY): Payer: Self-pay | Admitting: Pulmonary Disease

## 2012-10-03 ENCOUNTER — Ambulatory Visit (HOSPITAL_COMMUNITY)
Admission: RE | Admit: 2012-10-03 | Discharge: 2012-10-03 | Disposition: A | Discharge status: Home / Self Care | Payer: Medicare Other | Source: Ambulatory Visit | Attending: Pulmonary Disease | Admitting: Pulmonary Disease

## 2012-10-03 ENCOUNTER — Inpatient Hospital Stay (HOSPITAL_COMMUNITY)
Admission: AD | Admit: 2012-10-03 | Discharge: 2012-10-06 | DRG: 193 | Disposition: A | Discharge status: Home Health | Payer: Medicare Other | Source: Ambulatory Visit | Attending: Pulmonary Disease | Admitting: Pulmonary Disease

## 2012-10-03 DIAGNOSIS — N189 Chronic kidney disease, unspecified: Secondary | ICD-10-CM | POA: Diagnosis present

## 2012-10-03 DIAGNOSIS — Z9981 Dependence on supplemental oxygen: Secondary | ICD-10-CM

## 2012-10-03 DIAGNOSIS — R0602 Shortness of breath: Secondary | ICD-10-CM

## 2012-10-03 DIAGNOSIS — M545 Low back pain, unspecified: Secondary | ICD-10-CM | POA: Diagnosis present

## 2012-10-03 DIAGNOSIS — J189 Pneumonia, unspecified organism: Principal | ICD-10-CM | POA: Diagnosis present

## 2012-10-03 DIAGNOSIS — I350 Nonrheumatic aortic (valve) stenosis: Secondary | ICD-10-CM | POA: Diagnosis present

## 2012-10-03 DIAGNOSIS — K219 Gastro-esophageal reflux disease without esophagitis: Secondary | ICD-10-CM | POA: Diagnosis present

## 2012-10-03 DIAGNOSIS — I714 Abdominal aortic aneurysm, without rupture, unspecified: Secondary | ICD-10-CM | POA: Diagnosis present

## 2012-10-03 DIAGNOSIS — R609 Edema, unspecified: Secondary | ICD-10-CM | POA: Diagnosis present

## 2012-10-03 DIAGNOSIS — K5900 Constipation, unspecified: Secondary | ICD-10-CM | POA: Diagnosis not present

## 2012-10-03 DIAGNOSIS — G8929 Other chronic pain: Secondary | ICD-10-CM | POA: Diagnosis present

## 2012-10-03 DIAGNOSIS — Z951 Presence of aortocoronary bypass graft: Secondary | ICD-10-CM

## 2012-10-03 DIAGNOSIS — I2510 Atherosclerotic heart disease of native coronary artery without angina pectoris: Secondary | ICD-10-CM | POA: Diagnosis present

## 2012-10-03 DIAGNOSIS — I4891 Unspecified atrial fibrillation: Secondary | ICD-10-CM | POA: Diagnosis present

## 2012-10-03 DIAGNOSIS — J449 Chronic obstructive pulmonary disease, unspecified: Secondary | ICD-10-CM | POA: Diagnosis present

## 2012-10-03 DIAGNOSIS — I359 Nonrheumatic aortic valve disorder, unspecified: Secondary | ICD-10-CM | POA: Diagnosis present

## 2012-10-03 DIAGNOSIS — J4489 Other specified chronic obstructive pulmonary disease: Secondary | ICD-10-CM | POA: Diagnosis present

## 2012-10-03 DIAGNOSIS — Z7901 Long term (current) use of anticoagulants: Secondary | ICD-10-CM

## 2012-10-03 DIAGNOSIS — G9341 Metabolic encephalopathy: Secondary | ICD-10-CM | POA: Diagnosis present

## 2012-10-03 LAB — CBC WITH DIFFERENTIAL/PLATELET
Basophils Absolute: 0 10*3/uL (ref 0.0–0.1)
Basophils Relative: 0 % (ref 0–1)
Eosinophils Relative: 1 % (ref 0–5)
HCT: 39.9 % (ref 39.0–52.0)
Lymphocytes Relative: 14 % (ref 12–46)
MCHC: 33.6 g/dL (ref 30.0–36.0)
Monocytes Absolute: 1.1 10*3/uL — ABNORMAL HIGH (ref 0.1–1.0)
Neutro Abs: 8.8 10*3/uL — ABNORMAL HIGH (ref 1.7–7.7)
Platelets: 280 10*3/uL (ref 150–400)
RDW: 13.3 % (ref 11.5–15.5)
WBC: 11.8 10*3/uL — ABNORMAL HIGH (ref 4.0–10.5)

## 2012-10-03 LAB — COMPREHENSIVE METABOLIC PANEL
ALT: 24 U/L (ref 0–53)
Alkaline Phosphatase: 95 U/L (ref 39–117)
BUN: 25 mg/dL — ABNORMAL HIGH (ref 6–23)
CO2: 27 mEq/L (ref 19–32)
Chloride: 102 mEq/L (ref 96–112)
GFR calc Af Amer: 55 mL/min — ABNORMAL LOW (ref >90)
Glucose, Bld: 141 mg/dL — ABNORMAL HIGH (ref 70–99)
Potassium: 3.5 mEq/L (ref 3.5–5.1)
Sodium: 141 mEq/L (ref 135–145)
Total Bilirubin: 0.3 mg/dL (ref 0.3–1.2)
Total Protein: 7.4 g/dL (ref 6.0–8.3)

## 2012-10-03 LAB — EXPECTORATED SPUTUM ASSESSMENT W GRAM STAIN, RFLX TO RESP C

## 2012-10-03 MED ORDER — HYDROCODONE-ACETAMINOPHEN 5-325 MG PO TABS: 1.0000 | ORAL_TABLET | ORAL | Status: DC | PRN

## 2012-10-03 MED ORDER — ONDANSETRON HCL 4 MG/2ML IJ SOLN: 4.0000 mg | Freq: Four times a day (QID) | INTRAMUSCULAR | Status: DC | PRN

## 2012-10-03 MED ORDER — GUAIFENESIN-DM 100-10 MG/5ML PO SYRP
5.0000 mL | ORAL_SOLUTION | ORAL | Status: DC | PRN
  Administered 2012-10-04 – 2012-10-06 (×9): 5 mL via ORAL
  Filled 2012-10-03 (×9): qty 5

## 2012-10-03 MED ORDER — DOCUSATE SODIUM 100 MG PO CAPS
100.0000 mg | ORAL_CAPSULE | Freq: Two times a day (BID) | ORAL | Status: DC
  Administered 2012-10-03 – 2012-10-06 (×6): 100 mg via ORAL
  Filled 2012-10-03 (×6): qty 1

## 2012-10-03 MED ORDER — ZOLPIDEM TARTRATE 5 MG PO TABS
5.0000 mg | ORAL_TABLET | Freq: Every evening | ORAL | Status: DC | PRN
  Administered 2012-10-04 – 2012-10-05 (×2): 5 mg via ORAL
  Filled 2012-10-03 (×2): qty 1

## 2012-10-03 MED ORDER — DILTIAZEM HCL ER COATED BEADS 240 MG PO CP24
240.0000 mg | ORAL_CAPSULE | Freq: Every day | ORAL | Status: DC
  Administered 2012-10-04 – 2012-10-06 (×2): 240 mg via ORAL
  Filled 2012-10-03 (×3): qty 1

## 2012-10-03 MED ORDER — DIGOXIN 250 MCG PO TABS
0.2500 mg | ORAL_TABLET | Freq: Once | ORAL | Status: AC
  Administered 2012-10-03: 0.25 mg via ORAL
  Filled 2012-10-03: qty 1

## 2012-10-03 MED ORDER — ONDANSETRON HCL 4 MG PO TABS: 4.0000 mg | ORAL_TABLET | Freq: Four times a day (QID) | ORAL | Status: DC | PRN

## 2012-10-03 MED ORDER — SODIUM CHLORIDE 0.9 % IJ SOLN: 3.0000 mL | Freq: Two times a day (BID) | INTRAMUSCULAR | Status: DC

## 2012-10-03 MED ORDER — DEXTROSE 5 % IV SOLN
500.0000 mg | INTRAVENOUS | Status: DC
  Administered 2012-10-03 – 2012-10-05 (×3): 500 mg via INTRAVENOUS
  Filled 2012-10-03 (×6): qty 500

## 2012-10-03 MED ORDER — ALUM & MAG HYDROXIDE-SIMETH 200-200-20 MG/5ML PO SUSP: 30.0000 mL | Freq: Four times a day (QID) | ORAL | Status: DC | PRN

## 2012-10-03 MED ORDER — DEXTROSE 5 % IV SOLN
1.0000 g | INTRAVENOUS | Status: DC
  Administered 2012-10-03 – 2012-10-05 (×3): 1 g via INTRAVENOUS
  Filled 2012-10-03 (×6): qty 10

## 2012-10-03 MED ORDER — SODIUM CHLORIDE 0.9 % IJ SOLN: 3.0000 mL | INTRAMUSCULAR | Status: DC | PRN

## 2012-10-03 MED ORDER — SODIUM CHLORIDE 0.9 % IJ SOLN
3.0000 mL | Freq: Two times a day (BID) | INTRAMUSCULAR | Status: DC
  Administered 2012-10-03 – 2012-10-06 (×6): 3 mL via INTRAVENOUS

## 2012-10-03 MED ORDER — DIGOXIN 125 MCG PO TABS: 25.0000 mg | ORAL_TABLET | Freq: Every day | ORAL | Status: DC

## 2012-10-03 MED ORDER — NITROGLYCERIN 0.4 MG SL SUBL: 0.4000 mg | SUBLINGUAL_TABLET | SUBLINGUAL | Status: DC | PRN

## 2012-10-03 MED ORDER — METOPROLOL TARTRATE 25 MG PO TABS
25.0000 mg | ORAL_TABLET | Freq: Two times a day (BID) | ORAL | Status: DC
  Administered 2012-10-04 – 2012-10-06 (×4): 25 mg via ORAL
  Filled 2012-10-03 (×5): qty 1

## 2012-10-03 MED ORDER — DIGOXIN 125 MCG PO TABS
0.1250 mg | ORAL_TABLET | ORAL | Status: DC
  Administered 2012-10-04 – 2012-10-05 (×2): 0.125 mg via ORAL
  Filled 2012-10-03 (×2): qty 1

## 2012-10-03 MED ORDER — DABIGATRAN ETEXILATE MESYLATE 75 MG PO CAPS
75.0000 mg | ORAL_CAPSULE | Freq: Two times a day (BID) | ORAL | Status: DC
  Administered 2012-10-03 – 2012-10-06 (×6): 75 mg via ORAL
  Filled 2012-10-03 (×11): qty 1

## 2012-10-03 MED ORDER — DILTIAZEM HCL ER COATED BEADS 240 MG PO CP24
240.0000 mg | ORAL_CAPSULE | Freq: Once | ORAL | Status: AC
  Administered 2012-10-03: 240 mg via ORAL
  Filled 2012-10-03: qty 1

## 2012-10-03 MED ORDER — SODIUM CHLORIDE 0.9 % IV SOLN
250.0000 mL | INTRAVENOUS | Status: DC | PRN
  Administered 2012-10-03: 250 mL via INTRAVENOUS

## 2012-10-03 MED ORDER — LEVALBUTEROL HCL 0.63 MG/3ML IN NEBU
0.6300 mg | INHALATION_SOLUTION | Freq: Four times a day (QID) | RESPIRATORY_TRACT | Status: DC
  Administered 2012-10-03 – 2012-10-06 (×12): 0.63 mg via RESPIRATORY_TRACT
  Filled 2012-10-03 (×12): qty 3

## 2012-10-04 MED ORDER — SENNA 8.6 MG PO TABS
2.0000 | ORAL_TABLET | Freq: Every day | ORAL | Status: DC
  Administered 2012-10-04 – 2012-10-05 (×2): 17.2 mg via ORAL
  Filled 2012-10-04 (×4): qty 1

## 2012-10-04 NOTE — Progress Notes (Signed)
Subjective: He is awake but confused. I think this is from being in the hospital basically. He is coughing some and able to cough up a little bit of sputum.  Objective: Vital signs in last 24 hours: Temp:  [98.2 F (36.8 C)-98.3 F (36.8 C)] 98.3 F (36.8 C) (11/21 0425) Pulse Rate:  [88-101] 101  (11/21 0425) Resp:  [19-22] 20  (11/21 0425) BP: (104-127)/(58-76) 113/71 mmHg (11/21 0425) SpO2:  [94 %-96 %] 96 % (11/21 0649) Weight:  [62.778 kg (138 lb 6.4 oz)] 62.778 kg (138 lb 6.4 oz) (11/20 1212) Weight change:  Last BM Date: 10/03/12  Intake/Output from previous day:    PHYSICAL EXAM General appearance: alert, cooperative, mild distress and Confused Resp: rhonchi bilaterally Cardio: irregularly irregular rhythm GI: soft, non-tender; bowel sounds normal; no masses,  no organomegaly Extremities: extremities normal, atraumatic, no cyanosis or edema  Lab Results:    Basic Metabolic Panel:  Basename 10/03/12 1347  NA 141  K 3.5  CL 102  CO2 27  GLUCOSE 141*  BUN 25*  CREATININE 1.26  CALCIUM 9.6  MG --  PHOS --   Liver Function Tests:  Basename 10/03/12 1347  AST 20  ALT 24  ALKPHOS 95  BILITOT 0.3  PROT 7.4  ALBUMIN 3.2*   No results found for this basename: LIPASE:2,AMYLASE:2 in the last 72 hours No results found for this basename: AMMONIA:2 in the last 72 hours CBC:  Basename 10/03/12 1347  WBC 11.8*  NEUTROABS 8.8*  HGB 13.4  HCT 39.9  MCV 98.3  PLT 280   Cardiac Enzymes: No results found for this basename: CKTOTAL:3,CKMB:3,CKMBINDEX:3,TROPONINI:3 in the last 72 hours BNP: No results found for this basename: PROBNP:3 in the last 72 hours D-Dimer: No results found for this basename: DDIMER:2 in the last 72 hours CBG: No results found for this basename: GLUCAP:6 in the last 72 hours Hemoglobin A1C: No results found for this basename: HGBA1C in the last 72 hours Fasting Lipid Panel: No results found for this basename:  CHOL,HDL,LDLCALC,TRIG,CHOLHDL,LDLDIRECT in the last 72 hours Thyroid Function Tests: No results found for this basename: TSH,T4TOTAL,FREET4,T3FREE,THYROIDAB in the last 72 hours Anemia Panel: No results found for this basename: VITAMINB12,FOLATE,FERRITIN,TIBC,IRON,RETICCTPCT in the last 72 hours Coagulation: No results found for this basename: LABPROT:2,INR:2 in the last 72 hours Urine Drug Screen: Drugs of Abuse  No results found for this basename: labopia, cocainscrnur, labbenz, amphetmu, thcu, labbarb    Alcohol Level: No results found for this basename: ETH:2 in the last 72 hours Urinalysis: No results found for this basename: COLORURINE:2,APPERANCEUR:2,LABSPEC:2,PHURINE:2,GLUCOSEU:2,HGBUR:2,BILIRUBINUR:2,KETONESUR:2,PROTEINUR:2,UROBILINOGEN:2,NITRITE:2,LEUKOCYTESUR:2 in the last 72 hours Misc. Labs:  ABGS No results found for this basename: PHART,PCO2,PO2ART,TCO2,HCO3 in the last 72 hours CULTURES Recent Results (from the past 240 hour(s))  CULTURE, EXPECTORATED SPUTUM-ASSESSMENT     Status: Normal   Collection Time   10/03/12  9:30 PM      Component Value Range Status Comment   Specimen Description SPUTUM   Final    Special Requests Normal   Final    Sputum evaluation     Final    Value: THIS SPECIMEN IS ACCEPTABLE. RESPIRATORY CULTURE REPORT TO FOLLOW.   Report Status 10/03/2012 FINAL   Final    Studies/Results: Dg Chest 2 View  10/03/2012  *RADIOLOGY REPORT*  Clinical Data: Shortness of breath, congestion and cough.  CHEST - 2 VIEW  Comparison: 05/25/2011.  Findings: Trachea is line.  Heart size normal.  There is air space opacification in the right lower lobe.  Mild  scarring in the left lower lobe.  Lungs are otherwise clear.  No pleural fluid.  IMPRESSION: Right lower lobe air space opacification is indicative of pneumonia.  Follow-up to clearing is recommended.   Original Report Authenticated By: Leanna Battles, M.D.     Medications:  Prior to Admission:    Prescriptions prior to admission  Medication Sig Dispense Refill  . dabigatran (PRADAXA) 75 MG CAPS Take 75 mg by mouth every 12 (twelve) hours.      . digoxin (LANOXIN) 0.125 MG tablet Take 0.125 mg by mouth daily. Only takes Mon-Fri.      . diltiazem (DILACOR XR) 180 MG 24 hr capsule Take 180 mg by mouth daily.      . furosemide (LASIX) 40 MG tablet Take 40 mg by mouth 2 (two) times daily.      Marland Kitchen HYDROcodone-acetaminophen (VICODIN) 5-500 MG per tablet Take 1 tablet by mouth every 6 (six) hours as needed. Pain      . megestrol (MEGACE) 40 MG tablet Take 40 mg by mouth daily.       . metoprolol tartrate (LOPRESSOR) 25 MG tablet Take 25 mg by mouth 2 (two) times daily.      . sennosides-docusate sodium (SENOKOT-S) 8.6-50 MG tablet Take 1 tablet by mouth daily.      Marland Kitchen zolpidem (AMBIEN) 10 MG tablet Take 5 mg by mouth at bedtime. May take an additional 5 mg throughout the night if needed.      . nitroGLYCERIN (NITROSTAT) 0.4 MG SL tablet Place 0.4 mg under the tongue every 5 (five) minutes as needed.         Scheduled:   . azithromycin  500 mg Intravenous Q24H  . cefTRIAXone (ROCEPHIN)  IV  1 g Intravenous Q24H  . dabigatran  75 mg Oral Q12H  . digoxin  0.125 mg Oral Custom  . [COMPLETED] digoxin  0.25 mg Oral Once  . [COMPLETED] diltiazem  240 mg Oral Once  . diltiazem  240 mg Oral Daily  . docusate sodium  100 mg Oral BID  . levalbuterol  0.63 mg Nebulization Q6H  . metoprolol tartrate  25 mg Oral BID  . sodium chloride  3 mL Intravenous Q12H  . sodium chloride  3 mL Intravenous Q12H  . [DISCONTINUED] digoxin  25 mg Oral Daily   Continuous:  ZOX:WRUEAV chloride, alum & mag hydroxide-simeth, guaiFENesin-dextromethorphan, HYDROcodone-acetaminophen, nitroGLYCERIN, ondansetron (ZOFRAN) IV, ondansetron, sodium chloride, zolpidem  Assesment: He has pneumonia. He has some element of COPD. He has congestive heart failure and atrial fibrillation. He is confused this morning which I think is  from being in the hospital Active Problems:  * No active hospital problems. *     Plan: No change in treatments today    LOS: 1 day   Laiden Milles L 10/04/2012, 8:48 AM

## 2012-10-04 NOTE — Progress Notes (Signed)
UR Chart Review Completed  

## 2012-10-04 NOTE — Care Management Note (Signed)
    Page 1 of 2   10/05/2012     1:32:04 PM   CARE MANAGEMENT NOTE 10/05/2012  Patient:  Troy Hinton, Troy Hinton   Account Number:  [id number]  Date Initiated:  10/04/2012  Documentation initiated by:  Sharrie Rothman  Subjective/Objective Assessment:   Pt admitted from home with pneumonia. Pt lives alone but has a Product/process development scientist that is with pt 16 hours a day and the pts daughter is with him the rest of the time. Pt has O2, cane, and walker in the home. Pt will return home at dischar.     Action/Plan:   Will continue to follow for [facility] needs.   Anticipated DC Date:  10/07/2012   Anticipated DC Plan:  HOME W HOME HEALTH SERVICES      DC Planning Services  CM consult      [facility] Choice  HOME HEALTH   Choice offered to / List presented to:  C-4 Adult Children        HH arranged  HH-1 RN  HH-2 PT      [facility] agency  [facility]   Status of service:  Completed, signed off Medicare Important Message given?  YES (If response is "NO", the following Medicare IM given date fields will be blank) Date Medicare IM given:  10/05/2012 Date Additional Medicare IM given:    Discharge Disposition:  HOME W HOME HEALTH SERVICES  Per UR Regulation:    If discussed at Long Length of Stay Meetings, dates discussed:    Comments:  10/05/12 1325 Arlyss Queen, RN BSN CM Pt potential discharge on 10/06/12. Pts daughter chose [facility] for RN and PT. Pts private duty sitter will continue to stay with pt on same schedule. No DME needs noted. Alroy Bailiff of [facility] is aware of pt and will collect the pts information from the chart. Weekend staff will have to fax [facility] orders to [facility] and notify [facility] of discharge.  10/04/12 1040 Arlyss Queen, RN BSN CM

## 2012-10-04 NOTE — H&P (Signed)
NAME:  Troy, Hinton NO.:  [id number]  MEDICAL RECORD NO.:  [id number]  LOCATION:                                 FACILITY:  PHYSICIAN:  Maleeya Peterkin L. Juanetta Gosling, M.D.DATE OF BIRTH:  Mar 21, 1917  DATE OF ADMISSION: DATE OF DISCHARGE:  LH                             HISTORY & PHYSICAL   CHIEF COMPLAINT:  Pneumonia.  HISTORY:  Troy Hinton is a 78 year old, who came to my office because of shortness of breath and cough.  He has had a cough for sometime, but has had more trouble in the last few days.  He has had a little bit of fever.  I was contacted by his family about this, and I asked him to have an x-ray before he came in, and the x-ray showed that he has right lower lobe pneumonia.  He has been coughing and congested as mentioned.  PAST MEDICAL HISTORY:  Complicated and includes aortic stenosis which is pretty severe, coronary artery occlusive disease, chronic atrial fibrillation, a small abdominal aortic aneurysm, and he is chronically anticoagulated.  He has been on home oxygen and has some element of COPD.  He has chronic low back pain, and has had problems with GERD.  PAST SURGICAL HISTORY:  Surgically, he has had coronary artery bypass grafting.  FAMILY HISTORY:  Positive for Parkinson disease and hypertension.  SOCIAL HISTORY:  He does not smoke, use alcohol or illicit drugs.  He lives at home alone, but family is nearby.  ALLERGIES:  He has no known allergies.  MEDICATIONS:  Include: 1. Pradaxa 75 mg b.i.d. 2. Digoxin 125 mcg daily. 3. Diltiazem 180 mg daily. 4. Lasix 40 mg daily. 5. Hydrocodone/acetaminophen 5/500, four times a day as needed. 6. Megace 40 mg daily. 7. Metoprolol 25 mg b.i.d. 8. Senokot daily. 9. Ambien 5 mg at bedtime. 10.Nitroglycerin 0.4 as needed.  REVIEW OF SYSTEMS:  Except as mentioned is negative, but he has been losing weight.  PHYSICAL EXAMINATION:  VITAL SIGNS:  Blood pressure 127/76, pulse 88, temp 98.2,  respirations 22.  His height 5 feet 6 inches, weight 138 pounds, O2 sat 96%. HEENT:  His pupils are reactive.  His nose and throat are clear.  He is somewhat hard of hearing.  His mucous membranes are dry. NECK:  Supple. CHEST:  Rhonchi bilaterally, perhaps somewhat more on the right than on the left. HEART:  Irregular with a loud systolic heart murmur. ABDOMEN:  Soft without masses. EXTREMITIES:  2+ chronic pretibial edema. CENTRAL NERVOUS SYSTEM:  Grossly intact.  ASSESSMENT:  Then is that, he has pneumonia.  He has multiple other medical problems.  He is going to be admitted and treated as community- acquired pneumonia, but with his very complicated medical history, he needs to be in the hospital at least to start his treatment.     Kendal Raffo L. Juanetta Gosling, M.D.     ELH/MEDQ  D:  10/03/2012  T:  10/03/2012  Job:  [id number]

## 2012-10-04 NOTE — Progress Notes (Signed)
Disimpacted patient due to complaints of constipation. A few hard pieces of brown stool removed from the rectum. Pt tolerated well.

## 2012-10-05 DIAGNOSIS — G9341 Metabolic encephalopathy: Secondary | ICD-10-CM | POA: Diagnosis present

## 2012-10-05 DIAGNOSIS — G8929 Other chronic pain: Secondary | ICD-10-CM | POA: Diagnosis present

## 2012-10-05 DIAGNOSIS — J189 Pneumonia, unspecified organism: Secondary | ICD-10-CM | POA: Diagnosis present

## 2012-10-05 NOTE — Progress Notes (Signed)
Subjective: He is better he has no new complaints. He has been constipated. His breathing is improving  Objective: Vital signs in last 24 hours: Temp:  [98 F (36.7 C)-98.6 F (37 C)] 98 F (36.7 C) (11/22 1450) Pulse Rate:  [72-103] 99  (11/22 1450) Resp:  [18-20] 18  (11/22 1450) BP: (98-131)/(58-72) 112/72 mmHg (11/22 1450) SpO2:  [92 %-98 %] 96 % (11/22 1450) Weight:  [62.7 kg (138 lb 3.7 oz)] 62.7 kg (138 lb 3.7 oz) (11/22 0447) Weight change: -0.078 kg (-2.8 oz) Last BM Date: 10/03/12  Intake/Output from previous day:    PHYSICAL EXAM General appearance: alert, cooperative and mild distress Resp: rhonchi bilaterally Cardio: irregularly irregular rhythm GI: soft, non-tender; bowel sounds normal; no masses,  no organomegaly Extremities: extremities normal, atraumatic, no cyanosis or edema  Lab Results:    Basic Metabolic Panel:  Basename 10/03/12 1347  NA 141  K 3.5  CL 102  CO2 27  GLUCOSE 141*  BUN 25*  CREATININE 1.26  CALCIUM 9.6  MG --  PHOS --   Liver Function Tests:  Basename 10/03/12 1347  AST 20  ALT 24  ALKPHOS 95  BILITOT 0.3  PROT 7.4  ALBUMIN 3.2*   No results found for this basename: LIPASE:2,AMYLASE:2 in the last 72 hours No results found for this basename: AMMONIA:2 in the last 72 hours CBC:  Basename 10/03/12 1347  WBC 11.8*  NEUTROABS 8.8*  HGB 13.4  HCT 39.9  MCV 98.3  PLT 280   Cardiac Enzymes: No results found for this basename: CKTOTAL:3,CKMB:3,CKMBINDEX:3,TROPONINI:3 in the last 72 hours BNP: No results found for this basename: PROBNP:3 in the last 72 hours D-Dimer: No results found for this basename: DDIMER:2 in the last 72 hours CBG: No results found for this basename: GLUCAP:6 in the last 72 hours Hemoglobin A1C: No results found for this basename: HGBA1C in the last 72 hours Fasting Lipid Panel: No results found for this basename: CHOL,HDL,LDLCALC,TRIG,CHOLHDL,LDLDIRECT in the last 72 hours Thyroid Function  Tests: No results found for this basename: TSH,T4TOTAL,FREET4,T3FREE,THYROIDAB in the last 72 hours Anemia Panel: No results found for this basename: VITAMINB12,FOLATE,FERRITIN,TIBC,IRON,RETICCTPCT in the last 72 hours Coagulation: No results found for this basename: LABPROT:2,INR:2 in the last 72 hours Urine Drug Screen: Drugs of Abuse  No results found for this basename: labopia, cocainscrnur, labbenz, amphetmu, thcu, labbarb    Alcohol Level: No results found for this basename: ETH:2 in the last 72 hours Urinalysis: No results found for this basename: COLORURINE:2,APPERANCEUR:2,LABSPEC:2,PHURINE:2,GLUCOSEU:2,HGBUR:2,BILIRUBINUR:2,KETONESUR:2,PROTEINUR:2,UROBILINOGEN:2,NITRITE:2,LEUKOCYTESUR:2 in the last 72 hours Misc. Labs:  ABGS No results found for this basename: PHART,PCO2,PO2ART,TCO2,HCO3 in the last 72 hours CULTURES Recent Results (from the past 240 hour(s))  CULTURE, EXPECTORATED SPUTUM-ASSESSMENT     Status: Normal   Collection Time   10/03/12  9:30 PM      Component Value Range Status Comment   Specimen Description SPUTUM   Final    Special Requests Normal   Final    Sputum evaluation     Final    Value: THIS SPECIMEN IS ACCEPTABLE. RESPIRATORY CULTURE REPORT TO FOLLOW.   Report Status 10/03/2012 FINAL   Final   CULTURE, RESPIRATORY     Status: Normal (Preliminary result)   Collection Time   10/03/12  9:30 PM      Component Value Range Status Comment   Specimen Description SPUTUM EXPECTORATED   Final    Special Requests NONE   Final    Gram Stain     Final  Value: ABUNDANT WBC PRESENT,BOTH PMN AND MONONUCLEAR     MODERATE SQUAMOUS EPITHELIAL CELLS PRESENT     FEW GRAM NEGATIVE RODS     FEW GRAM POSITIVE RODS     FEW GRAM POSITIVE COCCI IN PAIRS   Culture NORMAL OROPHARYNGEAL FLORA   Final    Report Status PENDING   Incomplete    Studies/Results: No results found.  Medications:  Prior to Admission:  Prescriptions prior to admission  Medication Sig  Dispense Refill  . dabigatran (PRADAXA) 75 MG CAPS Take 75 mg by mouth every 12 (twelve) hours.      . digoxin (LANOXIN) 0.125 MG tablet Take 0.125 mg by mouth daily. Only takes Mon-Fri.      . diltiazem (DILACOR XR) 180 MG 24 hr capsule Take 180 mg by mouth daily.      . furosemide (LASIX) 40 MG tablet Take 40 mg by mouth 2 (two) times daily.      Marland Kitchen HYDROcodone-acetaminophen (VICODIN) 5-500 MG per tablet Take 1 tablet by mouth every 6 (six) hours as needed. Pain      . megestrol (MEGACE) 40 MG tablet Take 40 mg by mouth daily.       . metoprolol tartrate (LOPRESSOR) 25 MG tablet Take 25 mg by mouth 2 (two) times daily.      . sennosides-docusate sodium (SENOKOT-S) 8.6-50 MG tablet Take 1 tablet by mouth daily.      Marland Kitchen zolpidem (AMBIEN) 10 MG tablet Take 5 mg by mouth at bedtime. May take an additional 5 mg throughout the night if needed.      . nitroGLYCERIN (NITROSTAT) 0.4 MG SL tablet Place 0.4 mg under the tongue every 5 (five) minutes as needed.         Scheduled:   . azithromycin  500 mg Intravenous Q24H  . cefTRIAXone (ROCEPHIN)  IV  1 g Intravenous Q24H  . dabigatran  75 mg Oral Q12H  . digoxin  0.125 mg Oral Custom  . diltiazem  240 mg Oral Daily  . docusate sodium  100 mg Oral BID  . levalbuterol  0.63 mg Nebulization Q6H  . metoprolol tartrate  25 mg Oral BID  . senna  2 tablet Oral QHS  . sodium chloride  3 mL Intravenous Q12H  . sodium chloride  3 mL Intravenous Q12H   Continuous:  WUJ:WJXBJY chloride, alum & mag hydroxide-simeth, guaiFENesin-dextromethorphan, HYDROcodone-acetaminophen, nitroGLYCERIN, ondansetron (ZOFRAN) IV, ondansetron, sodium chloride, zolpidem  Assesment:he has pneumonia. He has multiple other medical problems but is improviing.he is weak and deconditioned Principal Problem:  *Pneumonia Active Problems:  Atrial fibrillation  Abdominal aortic aneurysm  Aortic stenosis  Chronic anticoagulation  Chronic kidney disease  Edema  Chronic back pain   Metabolic encephalopathy    Plan:I think he may be able to go home tomorrow. I will ask for physical therapy consultation    LOS: 2 days   Troy Hinton L 10/05/2012, 3:10 PM

## 2012-10-05 NOTE — Evaluation (Signed)
Physical Therapy Evaluation Patient Details Name: Troy Hinton MRN: [id number] DOB: [birth year] Today's Date: 10/05/2012 Time: 1128-1208 PT Time Calculation (min): 40 min  PT Assessment / Plan / Recommendation Clinical Impression  Pt is a pleasant 78 year old male referred to PT for mobility secondary to recent bought of pnemonia.  PLOF is assitance w/home health aide x16 [facility] and a daughter who lives 3 blocks away.  Reports that he will be able to recieve assitance when he goes home.  Today he is limited by overall fatigue due to current condition, however pt is highly motivated.  Compelted bed mobility with HOB elevated due to pneomia.  Used RW due to overall decreased strength.      PT Assessment  Patient needs continued PT services    Follow Up Recommendations  Home health PT    Does the patient have the potential to tolerate intense rehabilitation      Barriers to Discharge        Equipment Recommendations  None recommended by PT    Recommendations for Other Services OT consult   Frequency Min 3X/week    Precautions / Restrictions Restrictions Weight Bearing Restrictions: No   Pertinent Vitals/Pain Mild Pain to R knee w/mobility      Mobility  Bed Mobility Bed Mobility: Sitting - Scoot to Edge of Bed Sitting - Scoot to Edge of Bed: 5: Supervision Details for Bed Mobility Assistance: cueing for motivation Transfers Transfers: Sit to Stand;Stand to Sit Sit to Stand: 4: Min assist;From bed Stand to Sit: 4: Min assist;To chair/3-in-1 Details for Transfer Assistance: cueing for handplacement on EOB Ambulation/Gait Ambulation/Gait Assistance: 4: Min guard Ambulation Distance (Feet): 3 Feet Assistive device: Rolling walker Ambulation/Gait Assistance Details: cueing for posture General Gait Details: due to significant scolosis, pt has difficulty with appropriate posture, however it improves with min cueing.     Shoulder Instructions     Exercises General  Exercises - Lower Extremity Ankle Circles/Pumps: AROM;Both;5 reps Quad Sets: AROM;5 reps;Both Heel Slides: AROM;Both;5 reps Hip ABduction/ADduction: AROM;Both;5 reps Straight Leg Raises: AAROM;Both;5 reps   PT Diagnosis: Difficulty walking;Generalized weakness  PT Problem List: Decreased strength;Decreased activity tolerance PT Treatment Interventions: Gait training;Stair training;Functional mobility training;Therapeutic activities;Therapeutic exercise;Balance training;Patient/family education   PT Goals Acute Rehab PT Goals PT Goal Formulation: With patient Time For Goal Achievement: 10/12/12 Potential to Achieve Goals: Good Pt will go Sit to Stand: with supervision PT Goal: Sit to Stand - Progress: Goal set today Pt will go Stand to Sit: with supervision PT Goal: Stand to Sit - Progress: Goal set today Pt will Ambulate: 16 - 50 feet;with min assist;with least restrictive assistive device PT Goal: Ambulate - Progress: Goal set today  Visit Information  Last PT Received On: 10/05/12    Subjective Data  Subjective: I'm doing ok.  Pt caregiver present and reports 24/7 A   Prior Functioning  Home Living Available Help at Discharge: Home health Type of Home: House Home Layout: One level Home Adaptive Equipment: Walker - four wheeled;Quad cane;Bedside commode/3-in-1 Prior Function Level of Independence: Needs assistance Needs Assistance: Bathing;Dressing;Gait;Light Housekeeping Able to Take Stairs?: No Driving: No Communication Communication: No difficulties    Cognition       Extremity/Trunk Assessment Right Lower Extremity Assessment RLE ROM/Strength/Tone: Deficits RLE ROM/Strength/Tone Deficits: hip and knee strength 3/5  Left Lower Extremity Assessment LLE ROM/Strength/Tone: Deficits LLE ROM/Strength/Tone Deficits: hip and knee strength 3+/5   Balance Balance Balance Assessed: Yes Static Standing Balance Static Standing - Comment/# of Minutes:  standing EOB w/B UE  support, min guard A x3 minutes   End of Session PT - End of Session Equipment Utilized During Treatment: Gait belt Activity Tolerance: Patient limited by fatigue Patient left: in chair;with call bell/phone within reach;with family/visitor present   Troy Hinton, PT 10/05/2012, 12:32 PM

## 2012-10-06 LAB — CULTURE, RESPIRATORY W GRAM STAIN: Culture: NORMAL

## 2012-10-06 MED ORDER — HYDROCOD POLST-CHLORPHEN POLST 10-8 MG/5ML PO LQCR: 5.0000 mL | Freq: Two times a day (BID) | ORAL | Status: DC | PRN

## 2012-10-06 MED ORDER — AZITHROMYCIN 250 MG PO TABS: ORAL_TABLET | ORAL | Status: AC

## 2012-10-06 MED ORDER — ZOLPIDEM TARTRATE 5 MG PO TABS: 5.0000 mg | ORAL_TABLET | Freq: Every evening | ORAL | Status: DC | PRN

## 2012-10-06 MED ORDER — CEFUROXIME AXETIL 500 MG PO TABS: 500.0000 mg | ORAL_TABLET | Freq: Two times a day (BID) | ORAL | Status: DC

## 2012-10-06 MED ORDER — DABIGATRAN ETEXILATE MESYLATE 75 MG PO CAPS: 75.0000 mg | ORAL_CAPSULE | Freq: Two times a day (BID) | ORAL | Status: DC

## 2012-10-06 NOTE — Progress Notes (Signed)
Alert and oriented. Vital signs stable. Telemetry D/C'ed. Discharge instructions given. Prescriptions given. Patient and daughter verbalized understanding of instructions. Left floor via wheelchair with nursing staff and family member.  Discharged home with home health.  Heather Roberts 10/06/2012 11:03 AM

## 2012-10-06 NOTE — Progress Notes (Signed)
Home Health orders faxed to [facility] and received.

## 2012-10-06 NOTE — Progress Notes (Signed)
Subjective: He feels better. His constipation has improved. His breathing is better.  Objective: Vital signs in last 24 hours: Temp:  [98 F (36.7 C)-98.4 F (36.9 C)] 98.4 F (36.9 C) (11/23 0600) Pulse Rate:  [99-114] 107  (11/23 0902) Resp:  [18-20] 20  (11/23 0902) BP: (111-132)/(68-73) 111/68 mmHg (11/23 0902) SpO2:  [92 %-98 %] 97 % (11/23 0709) Weight:  [64.411 kg (142 lb)] 64.411 kg (142 lb) (11/23 0600) Weight change: 1.711 kg (3 lb 12.4 oz) Last BM Date: 10/06/12  Intake/Output from previous day: 11/22 0701 - 11/23 0700 In: 683 [P.O.:680; I.V.:3] Out: -   PHYSICAL EXAM General appearance: alert, cooperative and mild distress Resp: rhonchi bilaterally Cardio: irregularly irregular rhythm GI: soft, non-tender; bowel sounds normal; no masses,  no organomegaly Extremities: extremities normal, atraumatic, no cyanosis or edema  Lab Results:    Basic Metabolic Panel:  Basename 10/03/12 1347  NA 141  K 3.5  CL 102  CO2 27  GLUCOSE 141*  BUN 25*  CREATININE 1.26  CALCIUM 9.6  MG --  PHOS --   Liver Function Tests:  Basename 10/03/12 1347  AST 20  ALT 24  ALKPHOS 95  BILITOT 0.3  PROT 7.4  ALBUMIN 3.2*   No results found for this basename: LIPASE:2,AMYLASE:2 in the last 72 hours No results found for this basename: AMMONIA:2 in the last 72 hours CBC:  Basename 10/03/12 1347  WBC 11.8*  NEUTROABS 8.8*  HGB 13.4  HCT 39.9  MCV 98.3  PLT 280   Cardiac Enzymes: No results found for this basename: CKTOTAL:3,CKMB:3,CKMBINDEX:3,TROPONINI:3 in the last 72 hours BNP: No results found for this basename: PROBNP:3 in the last 72 hours D-Dimer: No results found for this basename: DDIMER:2 in the last 72 hours CBG: No results found for this basename: GLUCAP:6 in the last 72 hours Hemoglobin A1C: No results found for this basename: HGBA1C in the last 72 hours Fasting Lipid Panel: No results found for this basename:  CHOL,HDL,LDLCALC,TRIG,CHOLHDL,LDLDIRECT in the last 72 hours Thyroid Function Tests: No results found for this basename: TSH,T4TOTAL,FREET4,T3FREE,THYROIDAB in the last 72 hours Anemia Panel: No results found for this basename: VITAMINB12,FOLATE,FERRITIN,TIBC,IRON,RETICCTPCT in the last 72 hours Coagulation: No results found for this basename: LABPROT:2,INR:2 in the last 72 hours Urine Drug Screen: Drugs of Abuse  No results found for this basename: labopia, cocainscrnur, labbenz, amphetmu, thcu, labbarb    Alcohol Level: No results found for this basename: ETH:2 in the last 72 hours Urinalysis: No results found for this basename: COLORURINE:2,APPERANCEUR:2,LABSPEC:2,PHURINE:2,GLUCOSEU:2,HGBUR:2,BILIRUBINUR:2,KETONESUR:2,PROTEINUR:2,UROBILINOGEN:2,NITRITE:2,LEUKOCYTESUR:2 in the last 72 hours Misc. Labs:  ABGS No results found for this basename: PHART,PCO2,PO2ART,TCO2,HCO3 in the last 72 hours CULTURES Recent Results (from the past 240 hour(s))  CULTURE, EXPECTORATED SPUTUM-ASSESSMENT     Status: Normal   Collection Time   10/03/12  9:30 PM      Component Value Range Status Comment   Specimen Description SPUTUM   Final    Special Requests Normal   Final    Sputum evaluation     Final    Value: THIS SPECIMEN IS ACCEPTABLE. RESPIRATORY CULTURE REPORT TO FOLLOW.   Report Status 10/03/2012 FINAL   Final   CULTURE, RESPIRATORY     Status: Normal (Preliminary result)   Collection Time   10/03/12  9:30 PM      Component Value Range Status Comment   Specimen Description SPUTUM EXPECTORATED   Final    Special Requests NONE   Final    Gram Stain     Final  Value: ABUNDANT WBC PRESENT,BOTH PMN AND MONONUCLEAR     MODERATE SQUAMOUS EPITHELIAL CELLS PRESENT     FEW GRAM NEGATIVE RODS     FEW GRAM POSITIVE RODS     FEW GRAM POSITIVE COCCI IN PAIRS   Culture NORMAL OROPHARYNGEAL FLORA   Final    Report Status PENDING   Incomplete    Studies/Results: No results found.  Medications:    Prior to Admission:  Prescriptions prior to admission  Medication Sig Dispense Refill  . dabigatran (PRADAXA) 75 MG CAPS Take 75 mg by mouth every 12 (twelve) hours.      . digoxin (LANOXIN) 0.125 MG tablet Take 0.125 mg by mouth daily. Only takes Mon-Fri.      . diltiazem (DILACOR XR) 180 MG 24 hr capsule Take 180 mg by mouth daily.      . furosemide (LASIX) 40 MG tablet Take 40 mg by mouth 2 (two) times daily.      Marland Kitchen HYDROcodone-acetaminophen (VICODIN) 5-500 MG per tablet Take 1 tablet by mouth every 6 (six) hours as needed. Pain      . megestrol (MEGACE) 40 MG tablet Take 40 mg by mouth daily.       . metoprolol tartrate (LOPRESSOR) 25 MG tablet Take 25 mg by mouth 2 (two) times daily.      . sennosides-docusate sodium (SENOKOT-S) 8.6-50 MG tablet Take 1 tablet by mouth daily.      Marland Kitchen zolpidem (AMBIEN) 10 MG tablet Take 5 mg by mouth at bedtime. May take an additional 5 mg throughout the night if needed.      . nitroGLYCERIN (NITROSTAT) 0.4 MG SL tablet Place 0.4 mg under the tongue every 5 (five) minutes as needed.         Scheduled:   . azithromycin  500 mg Intravenous Q24H  . cefTRIAXone (ROCEPHIN)  IV  1 g Intravenous Q24H  . dabigatran  75 mg Oral Q12H  . digoxin  0.125 mg Oral Custom  . diltiazem  240 mg Oral Daily  . docusate sodium  100 mg Oral BID  . levalbuterol  0.63 mg Nebulization Q6H  . metoprolol tartrate  25 mg Oral BID  . senna  2 tablet Oral QHS  . sodium chloride  3 mL Intravenous Q12H  . sodium chloride  3 mL Intravenous Q12H   Continuous:  WUJ:WJXBJY chloride, alum & mag hydroxide-simeth, guaiFENesin-dextromethorphan, HYDROcodone-acetaminophen, nitroGLYCERIN, ondansetron (ZOFRAN) IV, ondansetron, sodium chloride, zolpidem  Assesment: He is improved. He has no new complaints. His breathing is better. His constipation is better. He is ready for discharge Principal Problem:  *Pneumonia Active Problems:  Atrial fibrillation  Abdominal aortic aneurysm  Aortic  stenosis  Chronic anticoagulation  Chronic kidney disease  Edema  Chronic back pain  Metabolic encephalopathy    Plan: Discharge home    LOS: 3 days   Kaedyn Polivka L 10/06/2012, 9:20 AM

## 2012-10-07 NOTE — Discharge Summary (Signed)
Physician Discharge Summary  Patient ID: Troy Hinton MRN: [id number] DOB/AGE: 06/26/1917 78 y.o. Primary Care Physician:Gaylord Seydel L, MD Admit date: 10/03/2012 Discharge date: 10/07/2012    Discharge Diagnoses:   Principal Problem:  *Pneumonia Active Problems:  Atrial fibrillation  Abdominal aortic aneurysm  Aortic stenosis  Chronic anticoagulation  Chronic kidney disease  Edema  Chronic back pain  Metabolic encephalopathy     Medication List     As of 10/07/2012  8:42 AM    TAKE these medications         azithromycin 250 MG tablet   Commonly known as: ZITHROMAX   Take 2 tablets (500 mg) on  Day 1,  followed by 1 tablet (250 mg) once daily on Days 2 through 5.      cefUROXime 500 MG tablet   Commonly known as: CEFTIN   Take 1 tablet (500 mg total) by mouth 2 (two) times daily.      chlorpheniramine-HYDROcodone 10-8 MG/5ML Lqcr   Commonly known as: TUSSIONEX   Take 5 mLs by mouth every 12 (twelve) hours as needed.      dabigatran 75 MG Caps   Commonly known as: PRADAXA   Take 1 capsule (75 mg total) by mouth every 12 (twelve) hours.      dabigatran 75 MG Caps   Commonly known as: PRADAXA   Take 75 mg by mouth every 12 (twelve) hours.      digoxin 0.125 MG tablet   Commonly known as: LANOXIN   Take 0.125 mg by mouth daily. Only takes Mon-Fri.      diltiazem 180 MG 24 hr capsule   Commonly known as: DILACOR XR   Take 180 mg by mouth daily.      furosemide 40 MG tablet   Commonly known as: LASIX   Take 40 mg by mouth 2 (two) times daily.      HYDROcodone-acetaminophen 5-500 MG per tablet   Commonly known as: VICODIN   Take 1 tablet by mouth every 6 (six) hours as needed. Pain      megestrol 40 MG tablet   Commonly known as: MEGACE   Take 40 mg by mouth daily.      metoprolol tartrate 25 MG tablet   Commonly known as: LOPRESSOR   Take 25 mg by mouth 2 (two) times daily.      nitroGLYCERIN 0.4 MG SL tablet   Commonly known as: NITROSTAT     Place 0.4 mg under the tongue every 5 (five) minutes as needed.      sennosides-docusate sodium 8.6-50 MG tablet   Commonly known as: SENOKOT-S   Take 1 tablet by mouth daily.      zolpidem 5 MG tablet   Commonly known as: AMBIEN   Take 1 tablet (5 mg total) by mouth at bedtime as needed for sleep.      zolpidem 10 MG tablet   Commonly known as: AMBIEN   Take 5 mg by mouth at bedtime. May take an additional 5 mg throughout the night if needed.        Discharged Condition: Improved    Consults: None  Significant Diagnostic Studies: Dg Chest 2 View  10/03/2012  *RADIOLOGY REPORT*  Clinical Data: Shortness of breath, congestion and cough.  CHEST - 2 VIEW  Comparison: 05/25/2011.  Findings: Trachea is line.  Heart size normal.  There is air space opacification in the right lower lobe.  Mild scarring in the left lower lobe.  Lungs are otherwise clear.  No pleural fluid.  IMPRESSION: Right lower lobe air space opacification is indicative of pneumonia.  Follow-up to clearing is recommended.   Original Report Authenticated By: Leanna Battles, M.D.     Lab Results: Basic Metabolic Panel: No results found for this basename: NA:2,K:2,CL:2,CO2:2,GLUCOSE:2,BUN:2,CREATININE:2,CALCIUM:2,MG:2,PHOS:2 in the last 72 hours Liver Function Tests: No results found for this basename: AST:2,ALT:2,ALKPHOS:2,BILITOT:2,PROT:2,ALBUMIN:2 in the last 72 hours   CBC: No results found for this basename: WBC:2,NEUTROABS:2,HGB:2,HCT:2,MCV:2,PLT:2 in the last 72 hours  Recent Results (from the past 240 hour(s))  CULTURE, EXPECTORATED SPUTUM-ASSESSMENT     Status: Normal   Collection Time   10/03/12  9:30 PM      Component Value Range Status Comment   Specimen Description SPUTUM   Final    Special Requests Normal   Final    Sputum evaluation     Final    Value: THIS SPECIMEN IS ACCEPTABLE. RESPIRATORY CULTURE REPORT TO FOLLOW.   Report Status 10/03/2012 FINAL   Final   CULTURE, RESPIRATORY      Status: Normal   Collection Time   10/03/12  9:30 PM      Component Value Range Status Comment   Specimen Description SPUTUM EXPECTORATED   Final    Special Requests NONE   Final    Gram Stain     Final    Value: ABUNDANT WBC PRESENT,BOTH PMN AND MONONUCLEAR     MODERATE SQUAMOUS EPITHELIAL CELLS PRESENT     FEW GRAM NEGATIVE RODS     FEW GRAM POSITIVE RODS     FEW GRAM POSITIVE COCCI IN PAIRS   Culture NORMAL OROPHARYNGEAL FLORA   Final    Report Status 10/06/2012 FINAL   Final      Hospital Course: He was admitted from my office. He had been fighting an upper respiratory infection for about 10 days and became much worse about 24 hours prior to admission. At that time he began coughing and having a lot of congestion and coughed up some sputum. He may have had fever. He had a chest x-ray done that showed a right lower lobe pneumonia. Because of his multiple comorbidities it was elected to treat him in the hospital. He was treated with Rocephin and Zithromax. He had some confusion which I think his encephalopathy. Over the next several days he improved markedly and was ready for discharge. His chest was much clearer. His confusion cleared.  Discharge Exam: Blood pressure 111/68, pulse 107, temperature 98.4 F (36.9 C), temperature source Oral, resp. rate 20, height 5\' 6"  (1.676 m), weight 64.411 kg (142 lb), SpO2 97.00%. He remains in chronic atrial fibrillation. His chest showed some rhonchi but clearer than before. His central nervous system was back to baseline  Disposition: Home with home health services      Discharge Orders    Future Appointments: Provider: Department: Dept Phone: Center:   11/21/2012 1:30 PM Kathlen Brunswick, MD [facility] [place] [phone] [id number]     Future Orders Please Complete By Expires   Home Health      Scheduling Instructions:   Please call his daughter Olene Floss home #[id number] cell phone number [phone] to schedule first home  health appointment   Questions: Responses:   To provide the following care/treatments PT    RN   Face-to-face encounter      Comments:   I Requan Hardge L certify that this patient is under my care and that I, or a nurse practitioner or physician's assistant working with me, had  a face-to-face encounter that meets the physician face-to-face encounter requirements with this patient on 10/06/2012. The encounter with the patient was in whole, or in part for the following medical condition(s) which is the primary reason for home health care (List medical condition): Pneumonia   Questions: Responses:   The encounter with the patient was in whole, or in part, for the following medical condition, which is the primary reason for home health care Pneumonia   I certify that, based on my findings, the following services are medically necessary home health services Nursing    Physical therapy   My clinical findings support the need for the above services Shortness of breath with activity   Further, I certify that my clinical findings support that this patient is homebound due to: Shortness of Breath with activity   To provide the following care/treatments PT    RN   Discharge patient         Follow-up Information    Follow up with [facility].   Contact information:   [address] [place] [address] [phone]         Signed: Fredirick Maudlin Pager [phone]  10/07/2012, 8:42 AM

## 2012-10-10 ENCOUNTER — Other Ambulatory Visit: Payer: Self-pay | Admitting: Cardiology

## 2012-11-21 ENCOUNTER — Ambulatory Visit: Payer: Medicare Other | Admitting: Cardiology

## 2012-11-23 ENCOUNTER — Ambulatory Visit: Payer: Medicare Other | Admitting: Adult Health

## 2012-11-26 ENCOUNTER — Encounter: Payer: Self-pay | Admitting: Cardiology

## 2012-11-26 ENCOUNTER — Ambulatory Visit (INDEPENDENT_AMBULATORY_CARE_PROVIDER_SITE_OTHER): Payer: Medicare Other | Admitting: Cardiology

## 2012-11-26 ENCOUNTER — Encounter: Payer: Self-pay | Admitting: *Deleted

## 2012-11-26 VITALS — BP 112/62 | HR 94 | Ht 66.0 in | Wt 141.1 lb

## 2012-11-26 DIAGNOSIS — R630 Anorexia: Secondary | ICD-10-CM

## 2012-11-26 DIAGNOSIS — I350 Nonrheumatic aortic (valve) stenosis: Secondary | ICD-10-CM

## 2012-11-26 DIAGNOSIS — I359 Nonrheumatic aortic valve disorder, unspecified: Secondary | ICD-10-CM

## 2012-11-26 DIAGNOSIS — N189 Chronic kidney disease, unspecified: Secondary | ICD-10-CM

## 2012-11-26 DIAGNOSIS — R0602 Shortness of breath: Secondary | ICD-10-CM

## 2012-11-26 DIAGNOSIS — J189 Pneumonia, unspecified organism: Secondary | ICD-10-CM

## 2012-11-26 DIAGNOSIS — I714 Abdominal aortic aneurysm, without rupture, unspecified: Secondary | ICD-10-CM

## 2012-11-26 DIAGNOSIS — I4891 Unspecified atrial fibrillation: Secondary | ICD-10-CM

## 2012-11-26 DIAGNOSIS — Z7901 Long term (current) use of anticoagulants: Secondary | ICD-10-CM

## 2012-11-26 NOTE — Patient Instructions (Addendum)
Your physician recommends that you schedule a follow-up appointment in: 3 MONTHS  Your physician recommends that you return for lab work in: TODAY AND IN 2 MONTHS

## 2012-11-26 NOTE — Progress Notes (Deleted)
Name: Troy Hinton    DOB: 12/26/1916  Age: 79 y.o.  MR#: [id number]       PCP:  Fredirick Maudlin, MD      Insurance: @PAYORNAME @   CC:   No chief complaint on file.  MEDICATION BOTTLES  VS BP 112/62  Pulse 94  Ht 5\' 6"  (1.676 m)  Wt 141 lb 1.9 oz (64.012 kg)  BMI 22.78 kg/m2  Weights Current Weight  11/26/12 141 lb 1.9 oz (64.012 kg)  10/06/12 142 lb (64.411 kg)  09/07/12 134 lb 1.3 oz (60.818 kg)    Blood Pressure  BP Readings from Last 3 Encounters:  11/26/12 112/62  10/06/12 111/68  09/07/12 116/69     Admit date:  (Not on file) Last encounter with RMR:  10/10/2012   Allergy No Known Allergies  Current Outpatient Prescriptions  Medication Sig Dispense Refill  . dabigatran (PRADAXA) 75 MG CAPS Take 1 capsule (75 mg total) by mouth every 12 (twelve) hours.  60 capsule  12  . digoxin (LANOXIN) 0.125 MG tablet Take 0.125 mg by mouth daily. Only takes Mon-Fri.      . diltiazem (DILACOR XR) 180 MG 24 hr capsule Take 240 mg by mouth daily.       . furosemide (LASIX) 40 MG tablet Take 40 mg by mouth 2 (two) times daily.      Marland Kitchen HYDROcodone-acetaminophen (VICODIN) 5-500 MG per tablet Take 1 tablet by mouth every 6 (six) hours as needed. Pain      . megestrol (MEGACE) 40 MG tablet Take 40 mg by mouth daily.       . metoprolol tartrate (LOPRESSOR) 25 MG tablet Take 25 mg by mouth 2 (two) times daily.      . nitroGLYCERIN (NITROSTAT) 0.4 MG SL tablet Place 0.4 mg under the tongue every 5 (five) minutes as needed.        . zolpidem (AMBIEN) 5 MG tablet Take 1 tablet (5 mg total) by mouth at bedtime as needed for sleep.  30 tablet  5    Discontinued Meds:    Medications Discontinued During This Encounter  Medication Reason  . zolpidem (AMBIEN) 10 MG tablet Error  . sennosides-docusate sodium (SENOKOT-S) 8.6-50 MG tablet Error  . dabigatran (PRADAXA) 75 MG CAPS Error  . chlorpheniramine-HYDROcodone (TUSSIONEX PENNKINETIC ER) 10-8 MG/5ML LQCR Error  . cefUROXime (CEFTIN)  500 MG tablet Error  . LASIX 80 MG tablet Error    Patient Active Problem List  Diagnosis  . Atrial fibrillation  . Abdominal aortic aneurysm  . Aortic stenosis  . Chronic anticoagulation  . Chronic kidney disease  . Edema  . Anorexia  . Laboratory test  . Pneumonia  . Chronic back pain  . Metabolic encephalopathy    LABS Admission on 10/03/2012, Discharged on 10/06/2012  Component Date Value  . Sodium 10/03/2012 141   . Potassium 10/03/2012 3.5   . Chloride 10/03/2012 102   . CO2 10/03/2012 27   . Glucose, Bld 10/03/2012 141*  . BUN 10/03/2012 25*  . Creatinine, Ser 10/03/2012 1.26   . Calcium 10/03/2012 9.6   . Total Protein 10/03/2012 7.4   . Albumin 10/03/2012 3.2*  . AST 10/03/2012 20   . ALT 10/03/2012 24   . Alkaline Phosphatase 10/03/2012 95   . Total Bilirubin 10/03/2012 0.3   . GFR calc non Af Amer 10/03/2012 47*  . GFR calc Af Amer 10/03/2012 55*  . WBC 10/03/2012 11.8*  . RBC 10/03/2012 4.06*  . Hemoglobin  10/03/2012 13.4   . HCT 10/03/2012 39.9   . MCV 10/03/2012 98.3   . [facility] 10/03/2012 33.0   . MCHC 10/03/2012 33.6   . RDW 10/03/2012 13.3   . Platelets 10/03/2012 280   . Neutrophils Relative 10/03/2012 75   . Neutro Abs 10/03/2012 8.8*  . Lymphocytes Relative 10/03/2012 14   . Lymphs Abs 10/03/2012 1.7   . Monocytes Relative 10/03/2012 10   . Monocytes Absolute 10/03/2012 1.1*  . Eosinophils Relative 10/03/2012 1   . Eosinophils Absolute 10/03/2012 0.1   . Basophils Relative 10/03/2012 0   . Basophils Absolute 10/03/2012 0.0   . Specimen Description 10/03/2012 SPUTUM   . Special Requests 10/03/2012 Normal   . Sputum evaluation 10/03/2012 THIS SPECIMEN IS ACCEPTABLE. RESPIRATORY CULTURE REPORT TO FOLLOW.   . Report Status 10/03/2012 10/03/2012 FINAL   . Specimen Description 10/03/2012 SPUTUM EXPECTORATED   . Special Requests 10/03/2012 NONE   . Gram Stain 10/03/2012                     Value:ABUNDANT WBC PRESENT,BOTH PMN AND MONONUCLEAR                          MODERATE SQUAMOUS EPITHELIAL CELLS PRESENT                         FEW GRAM NEGATIVE RODS                         FEW GRAM POSITIVE RODS                         FEW GRAM POSITIVE COCCI IN PAIRS  . Culture 10/03/2012 NORMAL OROPHARYNGEAL FLORA   . Report Status 10/03/2012 10/06/2012 FINAL   Office Visit on 09/07/2012  Component Date Value  . Sodium 09/12/2012 141   . Potassium 09/12/2012 4.1   . Chloride 09/12/2012 102   . CO2 09/12/2012 27   . Glucose, Bld 09/12/2012 75   . BUN 09/12/2012 31*  . Creat 09/12/2012 1.56*  . Total Bilirubin 09/12/2012 0.6   . Alkaline Phosphatase 09/12/2012 71   . AST 09/12/2012 15   . ALT 09/12/2012 16   . Total Protein 09/12/2012 6.3   . Albumin 09/12/2012 3.6   . Calcium 09/12/2012 8.7   . TSH 09/12/2012 3.974   . WBC 09/12/2012 8.6   . RBC 09/12/2012 4.27   . Hemoglobin 09/12/2012 14.3   . HCT 09/12/2012 40.9   . MCV 09/12/2012 95.8   . MCH 09/12/2012 33.5   . MCHC 09/12/2012 35.0   . RDW 09/12/2012 14.3   . Platelets 09/12/2012 206      Results for this Opt Visit:     Results for orders placed during the hospital encounter of 10/03/12  COMPREHENSIVE METABOLIC PANEL      Component Value Range   Sodium 141  135 - 145 mEq/L   Potassium 3.5  3.5 - 5.1 mEq/L   Chloride 102  96 - 112 mEq/L   CO2 27  19 - 32 mEq/L   Glucose, Bld 141 (*) 70 - 99 mg/dL   BUN 25 (*) 6 - 23 mg/dL   Creatinine, Ser 4.54  0.50 - 1.35 mg/dL   Calcium 9.6  8.4 - 09.8 mg/dL   Total Protein 7.4  6.0 - 8.3 g/dL   Albumin 3.2 (*)  3.5 - 5.2 g/dL   AST 20  0 - 37 U/L   ALT 24  0 - 53 U/L   Alkaline Phosphatase 95  39 - 117 U/L   Total Bilirubin 0.3  0.3 - 1.2 mg/dL   GFR calc non Af Amer 47 (*) >90 mL/min   GFR calc Af Amer 55 (*) >90 mL/min  CBC WITH DIFFERENTIAL      Component Value Range   WBC 11.8 (*) 4.0 - 10.5 K/uL   RBC 4.06 (*) 4.22 - 5.81 MIL/uL   Hemoglobin 13.4  13.0 - 17.0 g/dL   HCT 45.4  09.8 - 11.9 %   MCV 98.3  78.0 -  100.0 fL   MCH 33.0  26.0 - 34.0 pg   MCHC 33.6  30.0 - 36.0 g/dL   RDW 14.7  82.9 - 56.2 %   Platelets 280  150 - 400 K/uL   Neutrophils Relative 75  43 - 77 %   Neutro Abs 8.8 (*) 1.7 - 7.7 K/uL   Lymphocytes Relative 14  12 - 46 %   Lymphs Abs 1.7  0.7 - 4.0 K/uL   Monocytes Relative 10  3 - 12 %   Monocytes Absolute 1.1 (*) 0.1 - 1.0 K/uL   Eosinophils Relative 1  0 - 5 %   Eosinophils Absolute 0.1  0.0 - 0.7 K/uL   Basophils Relative 0  0 - 1 %   Basophils Absolute 0.0  0.0 - 0.1 K/uL  CULTURE, EXPECTORATED SPUTUM-ASSESSMENT      Component Value Range   Specimen Description SPUTUM     Special Requests Normal     Sputum evaluation       Value: THIS SPECIMEN IS ACCEPTABLE. RESPIRATORY CULTURE REPORT TO FOLLOW.   Report Status 10/03/2012 FINAL    CULTURE, RESPIRATORY      Component Value Range   Specimen Description SPUTUM EXPECTORATED     Special Requests NONE     Gram Stain       Value: ABUNDANT WBC PRESENT,BOTH PMN AND MONONUCLEAR     MODERATE SQUAMOUS EPITHELIAL CELLS PRESENT     FEW GRAM NEGATIVE RODS     FEW GRAM POSITIVE RODS     FEW GRAM POSITIVE COCCI IN PAIRS   Culture NORMAL OROPHARYNGEAL FLORA     Report Status 10/06/2012 FINAL      EKG Orders placed during the hospital encounter of 10/03/12  . EKG     Prior Assessment and Plan Problem List as of 11/26/2012            Cardiology Problems   Atrial fibrillation   Last Assessment & Plan Note   06/28/2012 Office Visit Signed 06/28/2012  2:25 PM by Kathlen Brunswick, MD    Heart rate is now well controlled.  With continued borderline blood pressures, metoprolol dosage will be decreased by 50%.  CNA's and patient's family will monitor blood pressure and heart rate at home.  I explained that measurements with an automated cuff may be somewhat inaccurate as the result of atrial fibrillation but that sustained tachycardia or hypotension should be reported to [place].    Abdominal aortic aneurysm   Last Assessment &  Plan Note   11/28/2011 Office Visit Signed 11/28/2011  2:22 PM by Kathlen Brunswick, MD    Recent abdominal ultrasound shows no change in aneurysm.  It appears that this will never be a significant clinical problem for this elderly gentleman.    Aortic stenosis  Last Assessment & Plan Note   09/07/2012 Office Visit Addendum 09/13/2012  6:14 PM by Kathlen Brunswick, MD    Aortic stenosis is severe based upon physical examination, but patient's level of symptoms is acceptable.  Due to the patient's advanced age and multiple medical problems, more aggressive therapy will be deferred.  He is interested in and willing to undergo percutaneous intervention should that prove necessary.      Other   Chronic anticoagulation   Last Assessment & Plan Note   06/28/2012 Office Visit Signed 06/28/2012  2:24 PM by Kathlen Brunswick, MD    No apparent complications of anticoagulation with pradaxa at low-dose.  Renal function will continue to be monitored and this drug discontinued should creatinine clearance fall below 30 cc per minute.    Chronic kidney disease   Last Assessment & Plan Note   09/07/2012 Office Visit Signed 09/13/2012  6:12 PM by Kathlen Brunswick, MD    Renal function relatively stable over the past year.  We will continue to monitor electrolytes, BUN and creatinine    Edema   Last Assessment & Plan Note   09/07/2012 Office Visit Signed 09/13/2012  6:11 PM by Kathlen Brunswick, MD    Edema persists, but has not worsened.  Adequately controlled with current medical regime.    Anorexia   Last Assessment & Plan Note   09/07/2012 Office Visit Signed 09/13/2012  6:16 PM by Kathlen Brunswick, MD    Associated with mild nausea but no emesis.  Etiology of GI symptoms is uncertain.    Laboratory test   Pneumonia   Chronic back pain   Metabolic encephalopathy       Imaging: No results found.   FRS Calculation: Score not calculated

## 2012-11-27 LAB — BASIC METABOLIC PANEL
CO2: 27 mEq/L (ref 19–32)
Chloride: 104 mEq/L (ref 96–112)
Potassium: 4 mEq/L (ref 3.5–5.3)
Sodium: 142 mEq/L (ref 135–145)

## 2012-11-27 NOTE — Assessment & Plan Note (Signed)
Abdominal aortic aneurysm was only 3.6 cm in diameter when last assessed in 2012. Due to patient's advanced age and multiple comorbidities, further imaging is not warranted.

## 2012-11-27 NOTE — Progress Notes (Signed)
Patient ID: Troy Hinton, male   DOB: 1916-12-06, 79 y.o.   MRN: [id number]  HPI: Scheduled return visit for this very nice elderly gentleman with atrial fibrillation and aortic stenosis.  He continues to have a very physically limited existence, but function well within those limitations. He experiences dyspnea with mild exertion but no chest pain, syncope, orthopnea nor PND. He was admitted to hospital within the past few weeks with pneumonia or bronchitis and did generally well, but was noted to have increased pedal edema at discharge. His dose of furosemide was double for a few days with resolution of that problem, which has not recurred despite returning to his usual dose.  Patient has been unable to weigh at home due to inability to balance on the scale, which was provided by his health insurer.  Prior to Admission medications   Medication Sig Start Date End Date Taking? Authorizing Provider  dabigatran (PRADAXA) 75 MG CAPS Take 1 capsule (75 mg total) by mouth every 12 (twelve) hours. 10/06/12  Yes Fredirick Maudlin, MD  digoxin (LANOXIN) 0.125 MG tablet Take 0.125 mg by mouth daily. Only takes Mon-Fri.   Yes Historical Provider, MD  diltiazem (DILACOR XR) 180 MG 24 hr capsule Take 240 mg by mouth daily.    Yes Historical Provider, MD  furosemide (LASIX) 40 MG tablet Take 40 mg by mouth 2 (two) times daily.   Yes Historical Provider, MD  HYDROcodone-acetaminophen (VICODIN) 5-500 MG per tablet Take 1 tablet by mouth every 6 (six) hours as needed. Pain    Yes Historical Provider, MD  megestrol (MEGACE) 40 MG tablet Take 40 mg by mouth daily.  05/18/12  Yes Historical Provider, MD  metoprolol tartrate (LOPRESSOR) 25 MG tablet Take 25 mg by mouth 2 (two) times daily. 06/28/12 06/28/13 Yes Kathlen Brunswick, MD  nitroGLYCERIN (NITROSTAT) 0.4 MG SL tablet Place 0.4 mg under the tongue every 5 (five) minutes as needed.     Yes Historical Provider, MD  zolpidem (AMBIEN) 5 MG tablet Take 1 tablet (5 mg  total) by mouth at bedtime as needed for sleep. 10/06/12  Yes Fredirick Maudlin, MD  No Known Allergies    Past medical history, social history, and family history reviewed and updated.  ROS: Denies palpitations, lightheadedness, cough, sputum production or fever. All other systems reviewed and are negative.  PHYSICAL EXAM: BP 112/62  Pulse 94  Ht 5\' 6"  (1.676 m)  Wt 64.012 kg (141 lb 1.9 oz)  BMI 22.78 kg/m2  General-Well developed; no acute distress Body habitus-frail, thin Neck-minimal JVD; no carotid bruits; pulses parvus Lungs-clear lung fields; resonant to percussion Cardiovascular-normal PMI; normal S1 and absent A2; grade 3/6 harsh mid to late peaking systolic ejection murmur across the precordium and radiating to the carotids Abdomen-normal bowel sounds; soft and non-tender without masses or organomegaly Musculoskeletal-No deformities, no cyanosis or clubbing Neurologic-Normal cranial nerves; symmetric strength and tone Skin-Warm, no significant lesions; pallor Extremities-distal pulses intact; 1-2+ ankle and pretibial edema  EKG: Atrial fibrillation with a controlled ventricular response; ventricular rate of 89 bpm; minor nonspecific ST-T wave abnormality. No previous tracing for comparison.  ASSESSMENT AND PLAN:   Bing, MD 11/27/2012 8:02 AM

## 2012-11-27 NOTE — Assessment & Plan Note (Signed)
Heart rate in atrial fibrillation is well-controlled, and anticoagulation has been well tolerated. Patient's daughter reports a few minor falls without loss of consciousness. The importance of avoiding these was discussed.

## 2012-11-27 NOTE — Assessment & Plan Note (Signed)
Renal function has been stable to improved over the past 18 months despite treatment with moderate dose diuretics and multiple physiologic insults.

## 2012-11-27 NOTE — Assessment & Plan Note (Signed)
Appetite has improved, and weight has stabilized, increasing 7 pounds since 08/2012.

## 2012-11-27 NOTE — Assessment & Plan Note (Signed)
Valvular disease remained severe but only mildly symptomatic. As long as quality of life is good with medical therapy, I am not inclined to consider transcutaneous valve implantation, but this concept was discussed with patient and his daughter, who would potentially be interested in proceeding if required.

## 2012-11-29 ENCOUNTER — Encounter: Payer: Self-pay | Admitting: Cardiology

## 2013-01-08 ENCOUNTER — Telehealth: Payer: Self-pay | Admitting: Cardiology

## 2013-01-08 NOTE — Telephone Encounter (Signed)
Per Dr Dietrich Pates at January OV, labs to be done day of visit and in 2 months and 2 month labs are now due.  Left message for daughter with this information and advised to call back if she has any further questions.

## 2013-01-08 NOTE — Telephone Encounter (Signed)
States that patient just received letter to do blood work in March.  States that he just had it in January and wants to know why he needs it again. / tgs

## 2013-01-22 ENCOUNTER — Other Ambulatory Visit: Payer: Self-pay | Admitting: Cardiology

## 2013-01-23 ENCOUNTER — Encounter: Payer: Self-pay | Admitting: Cardiology

## 2013-01-23 LAB — BASIC METABOLIC PANEL
BUN: 25 mg/dL — ABNORMAL HIGH (ref 6–23)
Chloride: 107 mEq/L (ref 96–112)
Glucose, Bld: 97 mg/dL (ref 70–99)
Potassium: 3.6 mEq/L (ref 3.5–5.3)
Sodium: 142 mEq/L (ref 135–145)

## 2013-01-24 ENCOUNTER — Encounter: Payer: Self-pay | Admitting: *Deleted

## 2013-01-31 ENCOUNTER — Other Ambulatory Visit (HOSPITAL_COMMUNITY): Payer: Self-pay | Admitting: Pulmonary Disease

## 2013-02-01 ENCOUNTER — Ambulatory Visit (HOSPITAL_COMMUNITY)
Admission: RE | Admit: 2013-02-01 | Discharge: 2013-02-01 | Disposition: A | Discharge status: Home / Self Care | Payer: Medicare Other | Source: Ambulatory Visit | Attending: Pulmonary Disease | Admitting: Pulmonary Disease

## 2013-02-01 ENCOUNTER — Other Ambulatory Visit (HOSPITAL_COMMUNITY): Payer: Self-pay | Admitting: Pulmonary Disease

## 2013-02-01 DIAGNOSIS — M79609 Pain in unspecified limb: Secondary | ICD-10-CM | POA: Insufficient documentation

## 2013-02-01 DIAGNOSIS — M7989 Other specified soft tissue disorders: Secondary | ICD-10-CM | POA: Insufficient documentation

## 2013-02-25 ENCOUNTER — Ambulatory Visit (HOSPITAL_COMMUNITY)
Admission: RE | Admit: 2013-02-25 | Discharge: 2013-02-25 | Disposition: A | Discharge status: Home / Self Care | Payer: Medicare Other | Source: Ambulatory Visit | Attending: Cardiology | Admitting: Cardiology

## 2013-02-25 ENCOUNTER — Encounter: Payer: Self-pay | Admitting: Cardiology

## 2013-02-25 ENCOUNTER — Ambulatory Visit (INDEPENDENT_AMBULATORY_CARE_PROVIDER_SITE_OTHER): Admitting: Cardiology

## 2013-02-25 VITALS — BP 114/60 | HR 97 | Ht 66.0 in | Wt 143.0 lb

## 2013-02-25 DIAGNOSIS — I4891 Unspecified atrial fibrillation: Secondary | ICD-10-CM

## 2013-02-25 DIAGNOSIS — Z0189 Encounter for other specified special examinations: Secondary | ICD-10-CM

## 2013-02-25 DIAGNOSIS — I350 Nonrheumatic aortic (valve) stenosis: Secondary | ICD-10-CM

## 2013-02-25 DIAGNOSIS — Z7901 Long term (current) use of anticoagulants: Secondary | ICD-10-CM

## 2013-02-25 DIAGNOSIS — N189 Chronic kidney disease, unspecified: Secondary | ICD-10-CM

## 2013-02-25 DIAGNOSIS — R059 Cough, unspecified: Secondary | ICD-10-CM | POA: Insufficient documentation

## 2013-02-25 DIAGNOSIS — R05 Cough: Secondary | ICD-10-CM | POA: Insufficient documentation

## 2013-02-25 DIAGNOSIS — I359 Nonrheumatic aortic valve disorder, unspecified: Secondary | ICD-10-CM

## 2013-02-25 DIAGNOSIS — R609 Edema, unspecified: Secondary | ICD-10-CM

## 2013-02-25 LAB — CBC
HCT: 42.5 % (ref 39.0–52.0)
Hemoglobin: 14.6 g/dL (ref 13.0–17.0)
MCV: 95.1 fL (ref 78.0–100.0)
RDW: 13.7 % (ref 11.5–15.5)
WBC: 10.5 10*3/uL (ref 4.0–10.5)

## 2013-02-25 LAB — COMPREHENSIVE METABOLIC PANEL
Albumin: 3.8 g/dL (ref 3.5–5.2)
BUN: 27 mg/dL — ABNORMAL HIGH (ref 6–23)
CO2: 28 mEq/L (ref 19–32)
Calcium: 9.3 mg/dL (ref 8.4–10.5)
Chloride: 101 mEq/L (ref 96–112)
Glucose, Bld: 72 mg/dL (ref 70–99)
Potassium: 3.5 mEq/L (ref 3.5–5.3)
Sodium: 142 mEq/L (ref 135–145)
Total Protein: 8 g/dL (ref 6.0–8.3)

## 2013-02-25 MED ORDER — BENZONATATE 200 MG PO CAPS: 200.0000 mg | ORAL_CAPSULE | Freq: Three times a day (TID) | ORAL | Status: DC

## 2013-02-25 MED ORDER — DILTIAZEM HCL ER 240 MG PO CP24: 240.0000 mg | ORAL_CAPSULE | Freq: Every day | ORAL | Status: DC

## 2013-02-25 MED ORDER — DABIGATRAN ETEXILATE MESYLATE 75 MG PO CAPS: 75.0000 mg | ORAL_CAPSULE | Freq: Two times a day (BID) | ORAL | Status: DC

## 2013-02-25 MED ORDER — DIGOXIN 125 MCG PO TABS: 0.1250 mg | ORAL_TABLET | Freq: Every day | ORAL | Status: DC

## 2013-02-25 MED ORDER — METOPROLOL TARTRATE 25 MG PO TABS: 25.0000 mg | ORAL_TABLET | Freq: Two times a day (BID) | ORAL | Status: DC

## 2013-02-25 MED ORDER — NITROGLYCERIN 0.4 MG SL SUBL: 0.4000 mg | SUBLINGUAL_TABLET | SUBLINGUAL | Status: DC | PRN

## 2013-02-25 MED ORDER — FUROSEMIDE 40 MG PO TABS: 40.0000 mg | ORAL_TABLET | Freq: Two times a day (BID) | ORAL | Status: DC

## 2013-02-25 NOTE — Progress Notes (Signed)
Patient ID: Troy Hinton, male   DOB: [birth year], 79 y.o.   MRN: [id number]  HPI: Schedule return visit for this very pleasant elderly gentleman with aortic stenosis. Over the past few weeks, he has had a persistent cough productive of mucoid sputum. He complains of tongue soreness, but this has not prevented adequate nutritional intake. He is virtually confined to wheelchair. Marked edema has responded to leg elevation and compression stockings.  Hospice has recently assumed responsibility for Troy Hinton care.  Current Outpatient Prescriptions  Medication Sig Dispense Refill  . chlorpheniramine (CHLOR-TRIMETON) 4 MG tablet Take 4 mg by mouth as needed for allergies.      Marland Kitchen dabigatran (PRADAXA) 75 MG CAPS Take 1 capsule (75 mg total) by mouth every 12 (twelve) hours.  60 capsule  6  . dextromethorphan (DELSYM) 30 MG/5ML liquid Take 60 mg by mouth as needed for cough.      . digoxin (LANOXIN) 0.125 MG tablet Take 1 tablet (0.125 mg total) by mouth daily. Only takes Mon-Fri.  36 tablet  3  . diltiazem (DILACOR XR) 240 MG 24 hr capsule Take 1 capsule (240 mg total) by mouth daily.  30 capsule  6  . furosemide (LASIX) 40 MG tablet Take 1 tablet (40 mg total) by mouth 2 (two) times daily.  30 tablet  6  . guaiFENesin (MUCINEX) 600 MG 12 hr tablet Take 1,200 mg by mouth 2 (two) times daily.      Marland Kitchen HYDROcodone-acetaminophen (VICODIN) 5-500 MG per tablet Take 1 tablet by mouth every 6 (six) hours as needed. Pain       . HYDROcodone-homatropine (HYCODAN) 5-1.5 MG/5ML syrup Take by mouth every 6 (six) hours as needed for cough.      . megestrol (MEGACE) 40 MG tablet Take 40 mg by mouth daily.       . metoprolol tartrate (LOPRESSOR) 25 MG tablet Take 1 tablet (25 mg total) by mouth 2 (two) times daily.  60 tablet  6  . nitroGLYCERIN (NITROSTAT) 0.4 MG SL tablet Place 1 tablet (0.4 mg total) under the tongue every 5 (five) minutes as needed.  90 tablet  3  . RAPAFLO 4 MG CAPS capsule Take 1 capsule by  mouth daily.      . Sennosides (SENNA LAX PO) Take by mouth as needed.      . zolpidem (AMBIEN) 5 MG tablet Take 1 tablet (5 mg total) by mouth at bedtime as needed for sleep.  30 tablet  5  . benzonatate (TESSALON) 200 MG capsule Take 1 capsule (200 mg total) by mouth 3 (three) times daily.  45 capsule  0   No current facility-administered medications for this visit.    No Known Allergies   Past medical history, social history, and family history reviewed and updated.  ROS: Dyspnea with minimal exertion; no chest discomfort, lightheadedness or syncope. All other systems reviewed and are negative.  PHYSICAL EXAM: BP 114/60  Pulse 97  Ht 5\' 6"  (1.676 m)  Wt 64.864 kg (143 lb)  BMI 23.09 kg/m2;  Body mass index is 23.09 kg/(m^2). General-Well developed; no acute distress Body habitus-Thin, mildly cachectic Neck-No JVD; carotid bruits versus radiation of murmur Lungs-few expiratory rhonchi; coarse bibasilar breath sounds; resonant to percussion Cardiovascular-normal PMI; normal S1 and absent A2; harsh grade 3/6 basilar systolic ejection murmur radiating to the carotids Abdomen-normal bowel sounds; soft and non-tender without masses or organomegaly Musculoskeletal-No deformities, no cyanosis or clubbing Neurologic-Normal cranial nerves; symmetric strength and tone Skin-Warm, no  significant lesions Extremities-distal pulses intact; 1+ edema  EKG: Tracing reviewed. Atrial fibrillation with a controlled ventricular response of 97 bpm; minor nonspecific T wave abnormality. No previous tracing for comparison.  Troy Bing, MD 02/25/2013  4:16 PM  ASSESSMENT AND PLAN

## 2013-02-25 NOTE — Patient Instructions (Addendum)
Your physician recommends that you schedule a follow-up appointment in: 60m onths  A chest x-ray takes a picture of the organs and structures inside the chest, including the heart, lungs, and blood vessels. This test can show several things, including, whether the heart is enlarges; whether fluid is building up in the lungs; and whether pacemaker / defibrillator leads are still in place.  Your physician recommends that you return for lab work in: Today  Your physician has recommended you make the following change in your medication:  1 - Tessalon Perles 1 three times a day

## 2013-02-25 NOTE — Assessment & Plan Note (Signed)
Aortic valve disease is severe and likely accounting for most of Troy Hinton current medical problems.  A chest x-ray will be obtained to assess cough. If there is no pulmonary edema, he will be treated symptomatically with a prescription antitussive plus Tessalon Perles. Therapeutic options were once again discussed with patient and his daughter. We all agree that a major intervention, such as percutaneous aortic valve replacement, is not appropriate. During initial discussion with hospice, patient indicated that he wanted to be resuscitated should that be required. We discuss this further, and I recommended reconsideration of his code status.

## 2013-02-25 NOTE — Assessment & Plan Note (Addendum)
Most recent creatinine of 1.4 in 09/2012 yield a calculated GFR of 29 cc per minute.

## 2013-02-25 NOTE — Assessment & Plan Note (Addendum)
Well controlled at present with conservative measures and moderate dose of diuretic.

## 2013-02-25 NOTE — Assessment & Plan Note (Addendum)
Patient continues to do well on oral anticoagulant, which will be continued for now. Renal function may become an issue in near future.

## 2013-02-25 NOTE — Progress Notes (Deleted)
Name: WYNTER GRAVE    DOB: [birth year]  Age: 79 y.o.  MR#: [id number]       PCP:  Fredirick Maudlin, MD      Insurance: Payor: [employer] MEDICARE  Plan: A[employer] MEDICARE COMPLETE  Product Type: *No Product type*    CC:   No chief complaint on file.  Venous US 01/31/13  Filed Vitals:   02/25/13 1506  BP: 114/60  Pulse: 97  Height: 5\' 6"  (1.676 m)  Weight: 143 lb (64.864 kg)    Weights Current Weight  02/25/13 143 lb (64.864 kg)  11/26/12 141 lb 1.9 oz (64.012 kg)  10/06/12 142 lb (64.411 kg)    Blood Pressure  BP Readings from Last 3 Encounters:  02/25/13 114/60  11/26/12 112/62  10/06/12 111/68     Admit date:  (Not on file) Last encounter with RMR:  01/08/2013   Allergy Review of patient's allergies indicates no known allergies.  Current Outpatient Prescriptions  Medication Sig Dispense Refill  . chlorpheniramine (CHLOR-TRIMETON) 4 MG tablet Take 4 mg by mouth as needed for allergies.      Marland Kitchen dabigatran (PRADAXA) 75 MG CAPS Take 1 capsule (75 mg total) by mouth every 12 (twelve) hours.  60 capsule  12  . dextromethorphan (DELSYM) 30 MG/5ML liquid Take 60 mg by mouth as needed for cough.      . digoxin (LANOXIN) 0.125 MG tablet Take 0.125 mg by mouth daily. Only takes Mon-Fri.      . diltiazem (DILACOR XR) 240 MG 24 hr capsule Take 240 mg by mouth daily.      . furosemide (LASIX) 40 MG tablet Take 40 mg by mouth 2 (two) times daily.      Marland Kitchen guaiFENesin (MUCINEX) 600 MG 12 hr tablet Take 1,200 mg by mouth 2 (two) times daily.      Marland Kitchen HYDROcodone-acetaminophen (VICODIN) 5-500 MG per tablet Take 1 tablet by mouth every 6 (six) hours as needed. Pain       . HYDROcodone-homatropine (HYCODAN) 5-1.5 MG/5ML syrup Take by mouth every 6 (six) hours as needed for cough.      . megestrol (MEGACE) 40 MG tablet Take 40 mg by mouth daily.       . metoprolol tartrate (LOPRESSOR) 25 MG tablet Take 25 mg by mouth 2 (two) times daily.      . nitroGLYCERIN (NITROSTAT) 0.4 MG SL tablet  Place 0.4 mg under the tongue every 5 (five) minutes as needed.        Marland Kitchen RAPAFLO 4 MG CAPS capsule Take 1 capsule by mouth daily.      . Sennosides (SENNA LAX PO) Take by mouth as needed.      . zolpidem (AMBIEN) 5 MG tablet Take 1 tablet (5 mg total) by mouth at bedtime as needed for sleep.  30 tablet  5   No current facility-administered medications for this visit.    Discontinued Meds:    Medications Discontinued During This Encounter  Medication Reason  . diltiazem (DILACOR XR) 180 MG 24 hr capsule Duplicate    Patient Active Problem List  Diagnosis  . Atrial fibrillation  . Abdominal aortic aneurysm  . Aortic stenosis  . Chronic anticoagulation  . Chronic kidney disease  . Edema  . Anorexia  . Laboratory test  . Chronic back pain    LABS    Component Value Date/Time   NA 142 01/22/2013 1405   NA 142 11/26/2012 1448   NA 141 10/03/2012 1347  K 3.6 01/22/2013 1405   K 4.0 11/26/2012 1448   K 3.5 10/03/2012 1347   CL 107 01/22/2013 1405   CL 104 11/26/2012 1448   CL 102 10/03/2012 1347   CO2 27 01/22/2013 1405   CO2 27 11/26/2012 1448   CO2 27 10/03/2012 1347   GLUCOSE 97 01/22/2013 1405   GLUCOSE 70 11/26/2012 1448   GLUCOSE 141* 10/03/2012 1347   BUN 25* 01/22/2013 1405   BUN 30* 11/26/2012 1448   BUN 25* 10/03/2012 1347   CREATININE 1.42* 01/22/2013 1405   CREATININE 1.40* 11/26/2012 1448   CREATININE 1.26 10/03/2012 1347   CREATININE 1.56* 09/12/2012 1555   CREATININE 1.36 11/25/2010 0516   CREATININE 1.41 11/24/2010 0701   CALCIUM 8.7 01/22/2013 1405   CALCIUM 9.1 11/26/2012 1448   CALCIUM 9.6 10/03/2012 1347   GFRNONAA 47* 10/03/2012 1347   GFRNONAA 49* 11/25/2010 0516   GFRNONAA 47* 11/24/2010 0701   GFRAA 55* 10/03/2012 1347   GFRAA  Value: 59        The eGFR has been calculated using the MDRD equation. This calculation has not been validated in all clinical situations. eGFR's persistently <60 mL/min signify possible Chronic Kidney Disease.* 11/25/2010 0516    GFRAA  Value: 57        The eGFR has been calculated using the MDRD equation. This calculation has not been validated in all clinical situations. eGFR's persistently <60 mL/min signify possible Chronic Kidney Disease.* 11/24/2010 0701   CMP     Component Value Date/Time   NA 142 01/22/2013 1405   K 3.6 01/22/2013 1405   CL 107 01/22/2013 1405   CO2 27 01/22/2013 1405   GLUCOSE 97 01/22/2013 1405   BUN 25* 01/22/2013 1405   CREATININE 1.42* 01/22/2013 1405   CREATININE 1.26 10/03/2012 1347   CALCIUM 8.7 01/22/2013 1405   PROT 7.4 10/03/2012 1347   ALBUMIN 3.2* 10/03/2012 1347   AST 20 10/03/2012 1347   ALT 24 10/03/2012 1347   ALKPHOS 95 10/03/2012 1347   BILITOT 0.3 10/03/2012 1347   GFRNONAA 47* 10/03/2012 1347   GFRAA 55* 10/03/2012 1347       Component Value Date/Time   WBC 11.8* 10/03/2012 1347   WBC 8.6 09/12/2012 1555   WBC 6.0 05/11/2012 1430   HGB 13.4 10/03/2012 1347   HGB 14.3 09/12/2012 1555   HGB 14.3 05/11/2012 1430   HCT 39.9 10/03/2012 1347   HCT 40.9 09/12/2012 1555   HCT 40.3 05/11/2012 1430   MCV 98.3 10/03/2012 1347   MCV 95.8 09/12/2012 1555   MCV 96.9 05/11/2012 1430    Lipid Panel     Component Value Date/Time   CHOL  Value: 120        ATP III CLASSIFICATION:  <200     mg/dL   Desirable  478-295  mg/dL   Borderline High  >=621    mg/dL   High        01/19/6577 0516   TRIG 49 11/25/2010 0516   HDL 48 11/25/2010 0516   CHOLHDL 2.5 11/25/2010 0516   VLDL 10 11/25/2010 0516   LDLCALC  Value: 62        Total Cholesterol/HDL:CHD Risk Coronary Heart Disease Risk Table                     Men   Women  1/2 Average Risk   3.4   3.3  Average Risk       5.0  4.4  2 X Average Risk   9.6   7.1  3 X Average Risk  23.4   11.0        Use the calculated Patient Ratio above and the CHD Risk Table to determine the patient's CHD Risk.        ATP III CLASSIFICATION (LDL):  <100     mg/dL   Optimal  161-096  mg/dL   Near or Above                    Optimal  130-159  mg/dL   Borderline   045-409  mg/dL   High  >811     mg/dL   Very High 07/28/7828 5621    ABG No results found for this basename: phart, pco2, pco2art, po2, po2art, hco3, tco2, acidbasedef, o2sat     Lab Results  Component Value Date   TSH 3.974 09/12/2012   BNP (last 3 results) No results found for this basename: PROBNP,  in the last 8760 hours Cardiac Panel (last 3 results) No results found for this basename: CKTOTAL, CKMB, TROPONINI, RELINDX,  in the last 72 hours  Iron/TIBC/Ferritin No results found for this basename: iron, tibc, ferritin     EKG Orders placed in visit on 02/25/13  . EKG 12-LEAD     Prior Assessment and Plan Problem List as of 02/25/2013     ICD-9-CM     Cardiology Problems   Atrial fibrillation   Last Assessment & Plan   11/26/2012 Office Visit Written 11/27/2012  8:12 AM by Kathlen Brunswick, MD     Heart rate in atrial fibrillation is well-controlled, and anticoagulation has been well tolerated. Patient's daughter reports a few minor falls without loss of consciousness. The importance of avoiding these was discussed.    Abdominal aortic aneurysm   Last Assessment & Plan   11/26/2012 Office Visit Written 11/27/2012  8:10 AM by Kathlen Brunswick, MD     Abdominal aortic aneurysm was only 3.6 cm in diameter when last assessed in 2012. Due to patient's advanced age and multiple comorbidities, further imaging is not warranted.    Aortic stenosis   Last Assessment & Plan   11/26/2012 Office Visit Written 11/27/2012  8:11 AM by Kathlen Brunswick, MD     Valvular disease remained severe but only mildly symptomatic. As long as quality of life is good with medical therapy, I am not inclined to consider transcutaneous valve implantation, but this concept was discussed with patient and his daughter, who would potentially be interested in proceeding if required.      Other   Chronic anticoagulation   Last Assessment & Plan   06/28/2012 Office Visit Written 06/28/2012  2:24 PM by Kathlen Brunswick, MD     No apparent complications of anticoagulation with pradaxa at low-dose.  Renal function will continue to be monitored and this drug discontinued should creatinine clearance fall below 30 cc per minute.    Chronic kidney disease   Last Assessment & Plan   11/26/2012 Office Visit Written 11/27/2012  8:16 AM by Kathlen Brunswick, MD     Renal function has been stable to improved over the past 18 months despite treatment with moderate dose diuretics and multiple physiologic insults.    Edema   Last Assessment & Plan   09/07/2012 Office Visit Written 09/13/2012  6:11 PM by Kathlen Brunswick, MD     Edema persists, but has not worsened.  Adequately controlled  with current medical regime.    Anorexia   Last Assessment & Plan   11/26/2012 Office Visit Written 11/27/2012  8:10 AM by Kathlen Brunswick, MD     Appetite has improved, and weight has stabilized, increasing 7 pounds since 08/2012.    Laboratory test   Chronic back pain       Imaging: US Arterial Seg Single  02/02/2013  *RADIOLOGY REPORT*  Clinical Data: Foot swelling and redness.  ULTRASOUND ANKLE/BRACHIAL INDICES BILATERAL  Comparison: None.  Findings: At rest, the right ABI is 1.37, left 1.38.  Triphasic wave forms recorded.  IMPRESSION:  Elevated bilateral ABIs, suggesting vascular noncompressibility usually due to medial or atherosclerotic calcification, limiting accuracy of the study.   Original Report Authenticated By: D. Andria Rhein, MD

## 2013-02-26 ENCOUNTER — Telehealth: Payer: Self-pay | Admitting: Cardiology

## 2013-02-26 NOTE — Telephone Encounter (Signed)
Daughter made aware of results.  

## 2013-02-26 NOTE — Telephone Encounter (Signed)
Results of lab work and CXR done on 02/25/13/tgs

## 2013-02-27 ENCOUNTER — Encounter: Payer: Self-pay | Admitting: Cardiology

## 2013-02-27 ENCOUNTER — Other Ambulatory Visit: Payer: Self-pay | Admitting: *Deleted

## 2013-02-27 ENCOUNTER — Encounter: Payer: Self-pay | Admitting: *Deleted

## 2013-02-27 DIAGNOSIS — I10 Essential (primary) hypertension: Secondary | ICD-10-CM

## 2013-02-27 MED ORDER — FUROSEMIDE 40 MG PO TABS: 60.0000 mg | ORAL_TABLET | Freq: Two times a day (BID) | ORAL | Status: DC

## 2013-03-04 ENCOUNTER — Telehealth: Payer: Self-pay | Admitting: Adult Health

## 2013-03-04 ENCOUNTER — Encounter: Payer: Self-pay | Admitting: Cardiology

## 2013-03-04 NOTE — Telephone Encounter (Signed)
Metoprolol increased to 1 tab twice a day.  Hospice nurse wants to know if it can be decreased to half tab twice a day due to bp's running low. Please call Olene Floss (patient's daughter) with instructions / tgs

## 2013-03-04 NOTE — Telephone Encounter (Signed)
Metoprolol can be decreased to 12.5 mg BID. If HR remains low, can discontinue.

## 2013-03-04 NOTE — Telephone Encounter (Signed)
Called pt home, noted no vm capability, called pt daughter to advise, noted daughter out of home,contacted her via mobile [phone], unable to speak with pt daughter, called back to home number and advised pt son in law to directions for pt to lower dosage and d/c is HR remains low, advised pt daughter can call our office at 8am tomorrow if she has any further questions, family member understood all directions, per noted will cut medication in half twice daily

## 2013-03-05 NOTE — Telephone Encounter (Signed)
Please advise 

## 2013-03-08 ENCOUNTER — Other Ambulatory Visit: Payer: Self-pay | Admitting: *Deleted

## 2013-03-08 DIAGNOSIS — I10 Essential (primary) hypertension: Secondary | ICD-10-CM

## 2013-03-12 ENCOUNTER — Encounter: Payer: Self-pay | Admitting: Cardiology

## 2013-03-12 LAB — BASIC METABOLIC PANEL
BUN: 27 mg/dL — ABNORMAL HIGH (ref 6–23)
CO2: 26 mEq/L (ref 19–32)
Chloride: 100 mEq/L (ref 96–112)
Glucose, Bld: 132 mg/dL — ABNORMAL HIGH (ref 70–99)
Potassium: 3.5 mEq/L (ref 3.5–5.3)

## 2013-03-13 ENCOUNTER — Encounter: Payer: Self-pay | Admitting: Cardiology

## 2013-03-13 ENCOUNTER — Encounter: Payer: Self-pay | Admitting: *Deleted

## 2013-03-13 ENCOUNTER — Telehealth: Payer: Self-pay | Admitting: *Deleted

## 2013-03-13 NOTE — Telephone Encounter (Signed)
Pt sent email via my chart as follows:  Is it your opinion that the cough he has is related to the extremely high BNP number? Would he benefit if he were admitted to the hospital? Also please note that your PA cut the dosage of Metoprolol back to what he was taking before the visit. Please give me your honest opinion of his condition. Your attention to this matter is greatly appreciated.   Pt sent another message today 03-12-13 to advise that he values you opinion on the matter, please advise

## 2013-03-14 ENCOUNTER — Encounter: Payer: Self-pay | Admitting: *Deleted

## 2013-03-14 NOTE — Telephone Encounter (Signed)
Cough could be related to congestive heart failure, which in turn could explain the elevated BNP level. Although certainly high, this level would be considered only moderate. We have had no clear evidence for congestive heart failure by chest x-ray or exam. We will consider an increase in his dose of furosemide to decrease fluid, possibly including fluid in his lungs, if cough does not improve.

## 2013-03-14 NOTE — Telephone Encounter (Signed)
Emailed pt RR response via My chart message, will await response

## 2013-03-15 ENCOUNTER — Telehealth: Payer: Self-pay | Admitting: Cardiology

## 2013-03-15 NOTE — Telephone Encounter (Signed)
Questions regarding lab work results / tgs

## 2013-04-05 ENCOUNTER — Other Ambulatory Visit: Payer: Self-pay

## 2013-04-05 NOTE — Telephone Encounter (Signed)
I spoke with [place] office -pts family wanted to know what is the status on   pt's  Pradaxa  Prior authorization. I Called pharmacy got the prior authorization call number. I  Spoke  With prior authoriazation  Specialist who explained to me that I needed to be very specific about pts medical history as far as Hospice in order to get pts pradaxa authorizide. I explained to prior auth  specialist  I need to talk with Hospice nurse . I called  Hospice nurse-Ashley ([phone]) [facility]. She informed me that she has taken care of this matter. Pt has Afib and CHF  -Pt ID # [id number] / [facility] [phone]

## 2013-04-09 ENCOUNTER — Telehealth: Payer: Self-pay | Admitting: Cardiology

## 2013-04-09 NOTE — Telephone Encounter (Signed)
Telephone call from Ms. Izola Price, RN. Patient is being managed by hospice, but has had hypertension and a possible recent syncopal episode. Even so, heart rate in atrial fibrillation is relatively high, typically in the 90s. Metoprolol 25 mg twice a day will be discontinued and heart rate and blood pressure monitored. If heart rate increases substantially, amiodarone will be added to therapy. If blood pressure does not improve, amiodarone will be added and diltiazem discontinued. Hospice to send appropriate records including medication reconciliation.

## 2013-04-12 ENCOUNTER — Telehealth: Payer: Self-pay | Admitting: Adult Health

## 2013-04-12 NOTE — Telephone Encounter (Signed)
Received phone call from [facility] RN concerning patients BP and HR. BP 90/60 with HR of 54. Metoprolol as been discontinued.   Will decrease diltiazem to 120 mg daily. (He has been taking 120 mg capsules). He will continue on digoxin for now. Follow up visit by Ms. Myers RN in 2 days for re-assessment.

## 2013-04-30 ENCOUNTER — Ambulatory Visit: Payer: Medicare Other | Admitting: Cardiology

## 2013-06-19 ENCOUNTER — Other Ambulatory Visit: Payer: Self-pay

## 2013-07-09 ENCOUNTER — Ambulatory Visit: Payer: Medicare Other | Admitting: Cardiovascular Disease

## 2013-07-14 ENCOUNTER — Encounter (HOSPITAL_COMMUNITY): Payer: Self-pay | Admitting: *Deleted

## 2013-07-14 ENCOUNTER — Emergency Department (HOSPITAL_COMMUNITY)
Admission: EM | Admit: 2013-07-14 | Discharge: 2013-07-14 | Disposition: A | Discharge status: Home / Self Care | Payer: Medicare Other | Attending: Emergency Medicine | Admitting: Emergency Medicine

## 2013-07-14 DIAGNOSIS — R21 Rash and other nonspecific skin eruption: Secondary | ICD-10-CM | POA: Insufficient documentation

## 2013-07-14 DIAGNOSIS — R011 Cardiac murmur, unspecified: Secondary | ICD-10-CM | POA: Insufficient documentation

## 2013-07-14 DIAGNOSIS — L02419 Cutaneous abscess of limb, unspecified: Secondary | ICD-10-CM | POA: Insufficient documentation

## 2013-07-14 DIAGNOSIS — Z79899 Other long term (current) drug therapy: Secondary | ICD-10-CM | POA: Insufficient documentation

## 2013-07-14 DIAGNOSIS — L0390 Cellulitis, unspecified: Secondary | ICD-10-CM

## 2013-07-14 DIAGNOSIS — I4891 Unspecified atrial fibrillation: Secondary | ICD-10-CM | POA: Insufficient documentation

## 2013-07-14 DIAGNOSIS — R062 Wheezing: Secondary | ICD-10-CM | POA: Insufficient documentation

## 2013-07-14 DIAGNOSIS — Z8679 Personal history of other diseases of the circulatory system: Secondary | ICD-10-CM | POA: Insufficient documentation

## 2013-07-14 HISTORY — DX: Dependence on supplemental oxygen: Z99.81

## 2013-07-14 LAB — CBC WITH DIFFERENTIAL/PLATELET
Eosinophils Absolute: 0.1 10*3/uL (ref 0.0–0.7)
Eosinophils Relative: 2 % (ref 0–5)
Hemoglobin: 12.9 g/dL — ABNORMAL LOW (ref 13.0–17.0)
Lymphocytes Relative: 16 % (ref 12–46)
Lymphs Abs: 1.3 10*3/uL (ref 0.7–4.0)
MCHC: 33.7 g/dL (ref 30.0–36.0)
Monocytes Absolute: 1.3 10*3/uL — ABNORMAL HIGH (ref 0.1–1.0)
Monocytes Relative: 16 % — ABNORMAL HIGH (ref 3–12)
Neutro Abs: 5.4 10*3/uL (ref 1.7–7.7)
Neutrophils Relative %: 67 % (ref 43–77)
Platelets: 188 10*3/uL (ref 150–400)
RDW: 13.8 % (ref 11.5–15.5)

## 2013-07-14 LAB — BASIC METABOLIC PANEL
BUN: 27 mg/dL — ABNORMAL HIGH (ref 6–23)
CO2: 31 mEq/L (ref 19–32)
Calcium: 9.2 mg/dL (ref 8.4–10.5)
Creatinine, Ser: 1.36 mg/dL — ABNORMAL HIGH (ref 0.50–1.35)
Glucose, Bld: 127 mg/dL — ABNORMAL HIGH (ref 70–99)

## 2013-07-14 MED ORDER — DEXTROSE 5 % IV SOLN: 1.0000 g | Freq: Once | INTRAVENOUS | Status: DC

## 2013-07-14 MED ORDER — DEXTROSE 5 % IV SOLN
INTRAVENOUS | Status: AC
  Administered 2013-07-14: 1 g via INTRAVENOUS
  Filled 2013-07-14: qty 10

## 2013-07-14 MED ORDER — CEPHALEXIN 500 MG PO CAPS: 500.0000 mg | ORAL_CAPSULE | Freq: Four times a day (QID) | ORAL | Status: DC

## 2013-07-14 MED ORDER — DEXTROSE 5 % IV SOLN
1.0000 g | Freq: Once | INTRAVENOUS | Status: AC
  Administered 2013-07-14: 1 g via INTRAVENOUS

## 2013-07-14 NOTE — ED Provider Notes (Signed)
CSN: [id number]     Arrival date & time 07/14/13  0945 History  This chart was scribed for Benny Lennert, MD by Caryn Bee, ED Scribe and Greggory Stallion, ED Scribe. This patient was seen in room APA15/APA15 and the patient's care was started at 10:09 AM.    Chief Complaint  Patient presents with  . Leg Swelling    Patient is a 79 y.o. male presenting with extremity pain. The history is provided by the patient and a relative. No language interpreter was used.  Extremity Pain This is a new problem. The current episode started 2 days ago. The problem occurs constantly. The problem has not changed since onset.Pertinent negatives include no chest pain, no abdominal pain and no headaches. Exacerbated by: palpation. Nothing relieves the symptoms. He has tried nothing for the symptoms.   HPI Comments: Troy Hinton is a 78 y.o. male with history of a-fib who presents to the Emergency Department complaining of swelling in his right leg that began 2 days ago upon removal of compression socks. Relative states that the leg was red, swollen, warm and painful to touch. Pt wears compression socks on both feet and usually has more swelling on the right leg. Pt had bypass surgery with no valve replacement about 20 years ago. He always uses 3 L of oxygen.   Past Medical History  Diagnosis Date  . Aortic stenosis     +insufficiency ,non-rheumatic; moderate with hyperdynamic LV function in 11/2010  . ASCVD (arteriosclerotic cardiovascular disease)     with angina  . Atrial fibrillation 11/2010  . Abdominal aortic aneurysm     Minimal; maximal diameter of 3.6 cm in 11/2010  . Chronic anticoagulation     Dabigatran  . Oxygen dependent     3 lpm via [place]   Past Surgical History  Procedure Laterality Date  . Coronary artery bypass graft  1994  . Colonoscopy  1997  . Esophagogastroduodenoscopy  11/23/2011    Procedure: ESOPHAGOGASTRODUODENOSCOPY (EGD);  Surgeon: Malissa Hippo, MD;  Location: [facility];  Service: Endoscopy;  Laterality: N/A;  100   Family History  Problem Relation Age of Onset  . Parkinsonism Brother   . Hypertension Son    History  Substance Use Topics  . Smoking status: Never Smoker   . Smokeless tobacco: Never Used  . Alcohol Use: No    Review of Systems  Constitutional: Negative for appetite change and fatigue.  HENT: Negative for congestion, sinus pressure and ear discharge.   Eyes: Negative for discharge.  Respiratory: Negative for cough.   Cardiovascular: Positive for leg swelling. Negative for chest pain.  Gastrointestinal: Negative for abdominal pain and diarrhea.  Genitourinary: Negative for frequency and hematuria.  Musculoskeletal: Negative for back pain.  Skin: Positive for rash.  Neurological: Negative for seizures and headaches.  Psychiatric/Behavioral: Negative for hallucinations.    Allergies  Review of patient's allergies indicates no known allergies.  Home Medications   Current Outpatient Rx  Name  Route  Sig  Dispense  Refill  . benzonatate (TESSALON) 200 MG capsule   Oral   Take 1 capsule (200 mg total) by mouth 3 (three) times daily.   45 capsule   0   . chlorpheniramine (CHLOR-TRIMETON) 4 MG tablet   Oral   Take 4 mg by mouth as needed for allergies.         Marland Kitchen dabigatran (PRADAXA) 75 MG CAPS   Oral   Take 1 capsule (75 mg total)  by mouth every 12 (twelve) hours.   60 capsule   6   . dextromethorphan (DELSYM) 30 MG/5ML liquid   Oral   Take 60 mg by mouth as needed for cough.         . digoxin (LANOXIN) 0.125 MG tablet   Oral   Take 1 tablet (0.125 mg total) by mouth daily. Only takes Mon-Fri.   36 tablet   3   . diltiazem (DILACOR XR) 240 MG 24 hr capsule   Oral   Take 120 mg by mouth daily.         . furosemide (LASIX) 40 MG tablet   Oral   Take 1.5 tablets (60 mg total) by mouth 2 (two) times daily.   45 tablet   6   . guaiFENesin (MUCINEX) 600 MG 12 hr tablet   Oral   Take 1,200 mg by  mouth 2 (two) times daily.         Marland Kitchen HYDROcodone-acetaminophen (VICODIN) 5-500 MG per tablet   Oral   Take 1 tablet by mouth every 6 (six) hours as needed. Pain          . HYDROcodone-homatropine (HYCODAN) 5-1.5 MG/5ML syrup   Oral   Take by mouth every 6 (six) hours as needed for cough.         . megestrol (MEGACE) 40 MG tablet   Oral   Take 40 mg by mouth daily.          . nitroGLYCERIN (NITROSTAT) 0.4 MG SL tablet   Sublingual   Place 1 tablet (0.4 mg total) under the tongue every 5 (five) minutes as needed.   90 tablet   3   . RAPAFLO 4 MG CAPS capsule   Oral   Take 1 capsule by mouth daily.         . Sennosides (SENNA LAX PO)   Oral   Take by mouth as needed.         . zolpidem (AMBIEN) 5 MG tablet   Oral   Take 1 tablet (5 mg total) by mouth at bedtime as needed for sleep.   30 tablet   5    BP 104/71  Pulse 73  Temp(Src) 97.4 F (36.3 C)  Resp 20  Wt 145 lb (65.772 kg)  BMI 23.41 kg/m2  SpO2 100% Physical Exam  Nursing note and vitals reviewed. Constitutional: He is oriented to person, place, and time. He appears well-developed.  HENT:  Head: Normocephalic.  Small growth on right cheek.   Eyes: Conjunctivae and EOM are normal. No scleral icterus.  Neck: Neck supple. No thyromegaly present.  Cardiovascular: Normal rate and regular rhythm.  Exam reveals no gallop and no friction rub.   Murmur heard. Systolic injection murmur.  Pulmonary/Chest: No stridor. He has no wheezes. He has no rales. He exhibits no tenderness.  Bilateral wheezing.   Abdominal: He exhibits no distension. There is no tenderness. There is no rebound.  Musculoskeletal: Normal range of motion.  1 plus edema to RLE with rash that looks like cellulitis.   Lymphadenopathy:    He has no cervical adenopathy.  Neurological: He is oriented to person, place, and time. Coordination normal.  Skin: No rash noted. No erythema.  Psychiatric: He has a normal mood and affect. His  behavior is normal.    ED Course  Procedures (including critical care time) DIAGNOSTIC STUDIES: Oxygen Saturation is 100% on [place], normal by my interpretation.    COORDINATION OF CARE: 10:16 AM-Discussed  treatment plan which includes blood tests and possible skin infection with pt at bedside and pt agreed to plan.     Labs Review Labs Reviewed - No data to display Imaging Review No results found.  MDM  No diagnosis found. Will tx for cellulitis and get venous doppler of leg in am    The chart was scribed for me under my direct supervision.  I personally performed the history, physical, and medical decision making and all procedures in the evaluation of this patient.Benny Lennert, MD 07/14/13 [phone]

## 2013-07-14 NOTE — ED Notes (Signed)
Pt presents to er with family with c/o bilateral leg redness, swelling that family noticed Friday after removing his compression stockings, swelling is worse in right leg vs left leg.

## 2013-07-14 NOTE — ED Notes (Signed)
Spoke with Dr. Estell Harpin concerning labs and explained that daughter seemed impatient. EDP to speak to patient and family member. New orders to be initiated

## 2013-07-14 NOTE — ED Notes (Signed)
Pts daughter to nurses station asking if labs were back. I explained the CBC was and we were still waiting on chemistry but it shouldn't be much longer. Daughter then stated,"Please stay on top of that for me."

## 2013-07-14 NOTE — ED Notes (Signed)
Left in c/o family for transport home; instructions, prescriptions and f/u information given/reviewed - verbalizes understanding.  

## 2013-07-14 NOTE — ED Notes (Signed)
Went into room to initiate saline lock and draw labs. Daughter had been to nurses station x 2 previously. She then asked how long labs take and I explained it typically takes an hour for results. She then asked why we were putting in saline lock. I explained that it was being placed in case he needs access and since we were drawing labs, this would save a stick. She then said, "I just don't know if he can wait that long, he is 79 years old." Pt lying in bed calm and cooperative.

## 2013-07-15 ENCOUNTER — Ambulatory Visit (HOSPITAL_COMMUNITY)
Admit: 2013-07-15 | Discharge: 2013-07-15 | Disposition: A | Discharge status: Home / Self Care | Payer: Medicare Other | Attending: Emergency Medicine | Admitting: Emergency Medicine

## 2013-07-15 DIAGNOSIS — M7989 Other specified soft tissue disorders: Secondary | ICD-10-CM | POA: Insufficient documentation

## 2013-07-15 NOTE — ED Provider Notes (Signed)
Patient will return for scheduled venous duplex to rule out DVT. Results have been reviewed, no evidence of DVT. Continue current management strategy, followup with primary doctor.  Gilda Crease, MD 07/15/13 [phone]

## 2013-07-24 ENCOUNTER — Other Ambulatory Visit: Payer: Self-pay | Admitting: Cardiology

## 2013-08-06 ENCOUNTER — Encounter: Payer: Self-pay | Admitting: *Deleted

## 2013-08-09 ENCOUNTER — Other Ambulatory Visit: Payer: Self-pay | Admitting: Cardiology

## 2013-09-19 ENCOUNTER — Other Ambulatory Visit: Payer: Self-pay

## 2013-09-24 ENCOUNTER — Ambulatory Visit (HOSPITAL_COMMUNITY)
Admission: RE | Admit: 2013-09-24 | Discharge: 2013-09-24 | Disposition: A | Discharge status: Home / Self Care | Payer: Medicare Other | Source: Ambulatory Visit | Attending: Pulmonary Disease | Admitting: Pulmonary Disease

## 2013-09-24 ENCOUNTER — Other Ambulatory Visit (HOSPITAL_COMMUNITY): Payer: Self-pay | Admitting: Pulmonary Disease

## 2013-09-24 DIAGNOSIS — R05 Cough: Secondary | ICD-10-CM

## 2013-09-24 DIAGNOSIS — R059 Cough, unspecified: Secondary | ICD-10-CM

## 2013-09-24 DIAGNOSIS — R5381 Other malaise: Secondary | ICD-10-CM | POA: Insufficient documentation

## 2013-10-12 ENCOUNTER — Other Ambulatory Visit: Payer: Self-pay | Admitting: Cardiovascular Disease

## 2013-11-18 ENCOUNTER — Telehealth: Payer: Self-pay | Admitting: *Deleted

## 2013-11-18 NOTE — Telephone Encounter (Signed)
Noted pt on 75mg , placed 4 boxes of samples at the front desk for pt pick up, called pt to advise and spoke with a woman that was sitting with the pt to advise that we must have the wrong pt, advised pt DOB and name confirmed, the woman declined to give name however verified this nurse had the correct pt and that his daughter must have called in for him, advised the samples are ready for pick up however we are now closed and will reopen at 8am, the woman understood and will have the pt daughter pick the samples up

## 2013-11-18 NOTE — Telephone Encounter (Signed)
PT want to pick up pradaxa samples 11/19/13 am

## 2013-12-16 ENCOUNTER — Telehealth: Payer: Self-pay | Admitting: *Deleted

## 2013-12-16 NOTE — Telephone Encounter (Signed)
6 boxes pradaxa to front desk for pt,pt aware

## 2013-12-16 NOTE — Telephone Encounter (Signed)
Pt needs samples of pradaxa 75 mg

## 2014-01-03 ENCOUNTER — Other Ambulatory Visit: Payer: Self-pay | Admitting: Cardiology

## 2014-01-08 ENCOUNTER — Telehealth: Payer: Self-pay | Admitting: *Deleted

## 2014-01-08 NOTE — Telephone Encounter (Signed)
Crystal from [facility] [employer] [facility] will send over a fax for the Doctor to sign. Former Dr Lattie Haw Pt.

## 2014-01-30 ENCOUNTER — Encounter (HOSPITAL_COMMUNITY): Payer: Self-pay | Admitting: Emergency Medicine

## 2014-01-30 ENCOUNTER — Emergency Department (HOSPITAL_COMMUNITY)
Admission: EM | Admit: 2014-01-30 | Discharge: 2014-01-31 | Disposition: A | Discharge status: Home / Self Care | Payer: Medicare Other | Attending: Emergency Medicine | Admitting: Emergency Medicine

## 2014-01-30 DIAGNOSIS — R112 Nausea with vomiting, unspecified: Secondary | ICD-10-CM | POA: Insufficient documentation

## 2014-01-30 DIAGNOSIS — Z7901 Long term (current) use of anticoagulants: Secondary | ICD-10-CM | POA: Insufficient documentation

## 2014-01-30 DIAGNOSIS — Z9981 Dependence on supplemental oxygen: Secondary | ICD-10-CM | POA: Insufficient documentation

## 2014-01-30 DIAGNOSIS — Z7902 Long term (current) use of antithrombotics/antiplatelets: Secondary | ICD-10-CM | POA: Insufficient documentation

## 2014-01-30 DIAGNOSIS — Z951 Presence of aortocoronary bypass graft: Secondary | ICD-10-CM | POA: Insufficient documentation

## 2014-01-30 DIAGNOSIS — Z79899 Other long term (current) drug therapy: Secondary | ICD-10-CM | POA: Insufficient documentation

## 2014-01-30 DIAGNOSIS — R197 Diarrhea, unspecified: Secondary | ICD-10-CM | POA: Insufficient documentation

## 2014-01-30 DIAGNOSIS — Z792 Long term (current) use of antibiotics: Secondary | ICD-10-CM | POA: Insufficient documentation

## 2014-01-30 DIAGNOSIS — I4891 Unspecified atrial fibrillation: Secondary | ICD-10-CM | POA: Insufficient documentation

## 2014-01-30 NOTE — ED Provider Notes (Signed)
CSN: [id number]     Arrival date & time 01/30/14  2340 History  This chart was scribed for Delora Fuel, MD by Anastasia Pall, ED Scribe. This patient was seen in room APA07/APA07 and the patient's care was started at 12:03 AM.   Chief Complaint  Patient presents with  . Emesis   (Consider location/radiation/quality/duration/timing/severity/associated sxs/prior Treatment) The history is provided by the patient. No language interpreter was used.   HPI Comments: Troy Hinton is a 80 y.o. male brought in with his daughter, who presents to the Emergency Department complaining of intermittent vomiting, onset 4 hours ago, with associated nausea and diarrhea. He denies currently feeling nauseous or feeling the need for a bowel movement. He denies pain, fever, chills, and any other associated symptoms.  PCP - Alonza Bogus, MD  Past Medical History  Diagnosis Date  . Aortic stenosis     +insufficiency ,non-rheumatic; moderate with hyperdynamic LV function in 11/2010  . ASCVD (arteriosclerotic cardiovascular disease)     with angina  . Atrial fibrillation 11/2010  . Abdominal aortic aneurysm     Minimal; maximal diameter of 3.6 cm in 11/2010  . Chronic anticoagulation     Dabigatran  . Oxygen dependent     3 lpm via [place]   Past Surgical History  Procedure Laterality Date  . Coronary artery bypass graft  1994  . Colonoscopy  1997  . Esophagogastroduodenoscopy  11/23/2011    Procedure: ESOPHAGOGASTRODUODENOSCOPY (EGD);  Surgeon: Rogene Houston, MD;  Location: [facility];  Service: Endoscopy;  Laterality: N/A;  100   Family History  Problem Relation Age of Onset  . Parkinsonism Brother   . Hypertension Son    History  Substance Use Topics  . Smoking status: Never Smoker   . Smokeless tobacco: Never Used  . Alcohol Use: No    Review of Systems  All other systems reviewed and are negative.   Allergies  Review of patient's allergies indicates no known allergies.  Home  Medications   Current Outpatient Rx  Name  Route  Sig  Dispense  Refill  . cephALEXin (KEFLEX) 500 MG capsule   Oral   Take 1 capsule (500 mg total) by mouth 4 (four) times daily.   28 capsule   0   . dabigatran (PRADAXA) 75 MG CAPS   Oral   Take 1 capsule (75 mg total) by mouth every 12 (twelve) hours.   60 capsule   6   . DIGOX 125 MCG tablet      TAKE ONE TABLET DAILY ON MONDAY THROUGH FRIDAY.   15 tablet   3   . diltiazem (CARDIZEM CD) 120 MG 24 hr capsule      TAKE 1 CAPSULE BY MOUTH DAILY.   30 capsule   3     Patient needs to call [phone] to schedule fol ...   . diltiazem (DILACOR XR) 120 MG 24 hr capsule   Oral   Take 120 mg by mouth 2 (two) times daily.         . furosemide (LASIX) 40 MG tablet      TAKE 1 AND 1/2 TABLET BY MOUTH TWICE DAILY.   90 tablet   0     Please call and make an appointment with our offic ...   . HYDROcodone-acetaminophen (VICODIN) 5-500 MG per tablet   Oral   Take 1 tablet by mouth every 6 (six) hours as needed. Pain          .  nitroGLYCERIN (NITROSTAT) 0.4 MG SL tablet   Sublingual   Place 1 tablet (0.4 mg total) under the tongue every 5 (five) minutes as needed.   90 tablet   3   . Sennosides (SENNA LAX PO)   Oral   Take 1 capsule by mouth daily.          . silodosin (RAPAFLO) 8 MG CAPS capsule   Oral   Take 8 mg by mouth at bedtime.         Marland Kitchen zolpidem (AMBIEN) 10 MG tablet   Oral   Take 5 mg by mouth at bedtime as needed for sleep.          BP 117/81  Pulse 120  Temp(Src) 98.3 F (36.8 C) (Oral)  Resp 18  SpO2 95%  Physical Exam  Nursing note and vitals reviewed. Constitutional: He is oriented to person, place, and time. He appears well-developed and well-nourished. No distress.  HENT:  Head: Normocephalic and atraumatic.  Eyes: EOM are normal.  Neck: Neck supple. No tracheal deviation present.  Cardiovascular: Normal rate.  An irregularly irregular rhythm present.  Murmur (3/6 high  pitched systolic ejection murmer consitent with aortic stenosis) heard. Pulmonary/Chest: Effort normal and breath sounds normal. No respiratory distress. He has no wheezes. He has no rales.  Abdominal: Soft. Bowel sounds are decreased. There is no tenderness. There is no rebound and no guarding.  Musculoskeletal: Normal range of motion.  Neurological: He is alert and oriented to person, place, and time.  Skin: Skin is warm and dry.  Psychiatric: He has a normal mood and affect. His behavior is normal.   ED Course  Procedures (including critical care time)  DIAGNOSTIC STUDIES: Oxygen Saturation is 95% on room air, adequate by my interpretation.    COORDINATION OF CARE: 12:06 AM-Discussed treatment plan which includes IV fluids and blood work with pt at bedside and pt agreed to plan.   Results for orders placed during the hospital encounter of 01/30/14  CBC WITH DIFFERENTIAL      Result Value Ref Range   WBC 13.3 (*) 4.0 - 10.5 K/uL   RBC 4.72  4.22 - 5.81 MIL/uL   Hemoglobin 15.0  13.0 - 17.0 g/dL   HCT 44.9  39.0 - 52.0 %   MCV 95.1  78.0 - 100.0 fL   MCH 31.8  26.0 - 34.0 pg   MCHC 33.4  30.0 - 36.0 g/dL   RDW 13.5  11.5 - 15.5 %   Platelets 150  150 - 400 K/uL   Neutrophils Relative % 93 (*) 43 - 77 %   Neutro Abs 12.3 (*) 1.7 - 7.7 K/uL   Lymphocytes Relative 3 (*) 12 - 46 %   Lymphs Abs 0.5 (*) 0.7 - 4.0 K/uL   Monocytes Relative 4  3 - 12 %   Monocytes Absolute 0.5  0.1 - 1.0 K/uL   Eosinophils Relative 0  0 - 5 %   Eosinophils Absolute 0.0  0.0 - 0.7 K/uL   Basophils Relative 0  0 - 1 %   Basophils Absolute 0.0  0.0 - 0.1 K/uL  BASIC METABOLIC PANEL      Result Value Ref Range   Sodium 145  137 - 147 mEq/L   Potassium 3.9  3.7 - 5.3 mEq/L   Chloride 101  96 - 112 mEq/L   CO2 29  19 - 32 mEq/L   Glucose, Bld 141 (*) 70 - 99 mg/dL   BUN 26 (*)  6 - 23 mg/dL   Creatinine, Ser 1.22  0.50 - 1.35 mg/dL   Calcium 8.8  8.4 - 10.5 mg/dL   GFR calc non Af Amer 49 (*) >90  mL/min   GFR calc Af Amer 57 (*) >90 mL/min  LACTIC ACID, PLASMA      Result Value Ref Range   Lactic Acid, Venous 1.8  0.5 - 2.2 mmol/L   MDM   Final diagnoses:  Nausea & vomiting    Nausea and vomiting and diarrhea which she most likely represent a viral gastroenteritis. He'll be given IV hydration and IV ondansetron for nausea. He no longer feels like he is going to have more diarrhea, so into motility agents are avoided. Following this treatment, he has had no further vomiting and his discharge. Family states that they have a prescription for antiemetic at home and advised that he can continue using that as needed.  I personally performed the services described in this documentation, which was scribed in my presence. The recorded information has been reviewed and is accurate.     Delora Fuel, MD 16/07/37 1062

## 2014-01-30 NOTE — ED Notes (Signed)
Per EMS, family called for nausea, vomiting and diarrhea x 3 hours.

## 2014-01-31 LAB — CBC WITH DIFFERENTIAL/PLATELET
BASOS ABS: 0 10*3/uL (ref 0.0–0.1)
Basophils Relative: 0 % (ref 0–1)
Eosinophils Absolute: 0 10*3/uL (ref 0.0–0.7)
Eosinophils Relative: 0 % (ref 0–5)
HEMATOCRIT: 44.9 % (ref 39.0–52.0)
HEMOGLOBIN: 15 g/dL (ref 13.0–17.0)
LYMPHS PCT: 3 % — AB (ref 12–46)
Lymphs Abs: 0.5 10*3/uL — ABNORMAL LOW (ref 0.7–4.0)
MCH: 31.8 pg (ref 26.0–34.0)
MCHC: 33.4 g/dL (ref 30.0–36.0)
MCV: 95.1 fL (ref 78.0–100.0)
MONO ABS: 0.5 10*3/uL (ref 0.1–1.0)
MONOS PCT: 4 % (ref 3–12)
NEUTROS ABS: 12.3 10*3/uL — AB (ref 1.7–7.7)
NEUTROS PCT: 93 % — AB (ref 43–77)
Platelets: 150 10*3/uL (ref 150–400)
RBC: 4.72 MIL/uL (ref 4.22–5.81)
RDW: 13.5 % (ref 11.5–15.5)
WBC: 13.3 10*3/uL — AB (ref 4.0–10.5)

## 2014-01-31 LAB — BASIC METABOLIC PANEL
BUN: 26 mg/dL — ABNORMAL HIGH (ref 6–23)
CHLORIDE: 101 meq/L (ref 96–112)
CO2: 29 meq/L (ref 19–32)
CREATININE: 1.22 mg/dL (ref 0.50–1.35)
Calcium: 8.8 mg/dL (ref 8.4–10.5)
GFR calc non Af Amer: 49 mL/min — ABNORMAL LOW (ref >90)
GFR, EST AFRICAN AMERICAN: 57 mL/min — AB (ref >90)
Glucose, Bld: 141 mg/dL — ABNORMAL HIGH (ref 70–99)
POTASSIUM: 3.9 meq/L (ref 3.7–5.3)
Sodium: 145 mEq/L (ref 137–147)

## 2014-01-31 LAB — LACTIC ACID, PLASMA: LACTIC ACID, VENOUS: 1.8 mmol/L (ref 0.5–2.2)

## 2014-01-31 MED ORDER — SODIUM CHLORIDE 0.9 % IV SOLN: 1000.0000 mL | Freq: Once | INTRAVENOUS | Status: DC

## 2014-01-31 MED ORDER — SODIUM CHLORIDE 0.9 % IV SOLN: 1000.0000 mL | INTRAVENOUS | Status: DC

## 2014-01-31 NOTE — ED Notes (Signed)
Patient's daughter at bedside complaining it was taking too much time to get her father discharged.  Another male at bedside. daughter stated she needed to get home to her grandchildren. Attempted to go over discharge papers but stated md had done so.  Immediately a wheelchair was brought to room to help transport him to car.  daughter yelled out to alfred to speed it up a gear. So alrefd immediately took him to car.

## 2014-01-31 NOTE — ED Notes (Signed)
Pt family stated no one went over information with [place] as we were walking to car. I went over discharge paper work and offered to have nurse or someone else come and go over them family declined. At family's  car I offered again to have a nurse answer any questions family may have before they leave family declined again.

## 2014-01-31 NOTE — Discharge Instructions (Signed)

## 2014-02-07 ENCOUNTER — Other Ambulatory Visit: Payer: Self-pay | Admitting: Cardiovascular Disease

## 2014-02-07 ENCOUNTER — Telehealth: Payer: Self-pay

## 2014-02-07 NOTE — Telephone Encounter (Signed)
error 

## 2014-02-08 ENCOUNTER — Other Ambulatory Visit: Payer: Self-pay | Admitting: Adult Health

## 2014-02-10 ENCOUNTER — Telehealth: Payer: Self-pay | Admitting: *Deleted

## 2014-02-10 NOTE — Telephone Encounter (Signed)
Pt needs samples of pradaxa 75 mg

## 2014-03-25 ENCOUNTER — Telehealth: Payer: Self-pay | Admitting: *Deleted

## 2014-03-25 NOTE — Telephone Encounter (Signed)
Pt is needing pradaxa samples

## 2014-04-17 ENCOUNTER — Encounter: Payer: Self-pay | Admitting: Internal Medicine

## 2014-04-17 ENCOUNTER — Ambulatory Visit (INDEPENDENT_AMBULATORY_CARE_PROVIDER_SITE_OTHER): Payer: Medicare Other | Admitting: Internal Medicine

## 2014-04-17 VITALS — BP 98/62 | HR 57 | Ht 70.0 in | Wt 140.0 lb

## 2014-04-17 DIAGNOSIS — E782 Mixed hyperlipidemia: Secondary | ICD-10-CM

## 2014-04-17 DIAGNOSIS — I10 Essential (primary) hypertension: Secondary | ICD-10-CM

## 2014-04-17 NOTE — Patient Instructions (Signed)
Your physician wants you to follow-up in: January 2016 with Dr.Ross You will receive a reminder letter in the mail two months in advance. If you don't receive a letter, please call our office to schedule the follow-up appointment.    Your physician recommends that you continue on your current medications as directed. Please refer to the Current Medication list given to you today.    We are giving you Pradaxa samples today     Thank you for choosing [facility] !

## 2014-04-17 NOTE — Progress Notes (Signed)
   HPI: Patient is a 80 yo with history of AS and atrial fibrillation.  He was previously followed by Est[facility] Dooms, seen last spring.   He is not very active.  Denies signif CP  Breathing is OK  Sleeping OK  No PND  No dizziness.    Current Outpatient Prescriptions  Medication Sig Dispense Refill  . dabigatran (PRADAXA) 75 MG CAPS Take 1 capsule (75 mg total) by mouth every 12 (twelve) hours.  60 capsule  6  . DIGOX 125 MCG tablet TAKE ONE TABLET DAILY ON MONDAY THROUGH FRIDAY.  30 tablet  6  . diltiazem (CARDIZEM CD) 120 MG 24 hr capsule TAKE 1 CAPSULE BY MOUTH DAILY.  30 capsule  3  . finasteride (PROSCAR) 5 MG tablet Take 5 mg by mouth daily.       . furosemide (LASIX) 40 MG tablet TAKE 1 AND 1/2 TABLET BY MOUTH TWICE DAILY.  90 tablet  3  . HYDROcodone-acetaminophen (VICODIN) 5-500 MG per tablet Take 1 tablet by mouth every 6 (six) hours as needed. Pain       . nitroGLYCERIN (NITROSTAT) 0.4 MG SL tablet Place 1 tablet (0.4 mg total) under the tongue every 5 (five) minutes as needed.  90 tablet  3  . Sennosides (SENNA LAX PO) Take 2 capsules by mouth daily.       . silodosin (RAPAFLO) 8 MG CAPS capsule Take 8 mg by mouth at bedtime.      Marland Kitchen zolpidem (AMBIEN) 10 MG tablet Take 5 mg by mouth at bedtime as needed for sleep.       No current facility-administered medications for this visit.    No Known Allergies   Past medical history, social history, and family history reviewed and updated.  ROS: All systeoms reviewed  Neg to above problem except as noted.  PHYSICAL EXAM: BP 98/62  Pulse 57  Ht 5\' 10"  (1.778 m)  Wt 140 lb (63.504 kg)  BMI 20.09 kg/m2  SpO2 95%;  Body mass index is 20.09 kg/(m^2). General-Well developed; no acute distress Body habitus-Thin, mildly cachectic Neck-No JVD; carotid bruits versus radiation of murmur Lungs-CTA Cardiovascular-normal PMI; normal S1 and absent A2; harsh grade 3/6 basilar systolic ejection murmur radiating to the carotids Abdomen-normal bowel  sounds; soft and non-tender without masses or organomegaly Musculoskeletal-No deformities, no cyanosis or clubbing Neurologic-Normal cranial nerves; symmetric strength and tone Skin-Warm, no significant lesions Extremities-distal pulses intact; 1+ edema  EKG  Atril fib 81 bpm.  Nonspecific ST T wave changes      Impression  1.  Aortic stenosis   Severe on exam.  NO recent echo.  Currently without signif symptoms.   Will review possiblity of intervention.    2 Atrial fibrillation.  Continue rate control.      Fay Records, MD 04/17/2014  10:42 AM

## 2014-04-28 ENCOUNTER — Encounter: Payer: Self-pay | Admitting: Internal Medicine

## 2014-05-02 ENCOUNTER — Other Ambulatory Visit: Payer: Self-pay

## 2014-05-02 DIAGNOSIS — I350 Nonrheumatic aortic (valve) stenosis: Secondary | ICD-10-CM

## 2014-05-06 ENCOUNTER — Other Ambulatory Visit (HOSPITAL_COMMUNITY): Payer: Medicare Other

## 2014-05-19 ENCOUNTER — Telehealth: Payer: Self-pay | Admitting: Internal Medicine

## 2014-05-19 ENCOUNTER — Ambulatory Visit (HOSPITAL_COMMUNITY)
Admission: RE | Admit: 2014-05-19 | Discharge: 2014-05-19 | Disposition: A | Discharge status: Home / Self Care | Payer: Medicare Other | Source: Ambulatory Visit | Attending: Internal Medicine | Admitting: Internal Medicine

## 2014-05-19 DIAGNOSIS — I350 Nonrheumatic aortic (valve) stenosis: Secondary | ICD-10-CM

## 2014-05-19 DIAGNOSIS — I359 Nonrheumatic aortic valve disorder, unspecified: Secondary | ICD-10-CM

## 2014-05-19 NOTE — Telephone Encounter (Signed)
Pradaxa 75 mg capsules # 2 boxes dispensed to the patient's daughter.

## 2014-05-29 ENCOUNTER — Other Ambulatory Visit: Payer: Self-pay | Admitting: Cardiology

## 2014-06-11 ENCOUNTER — Other Ambulatory Visit: Payer: Self-pay | Admitting: Adult Health

## 2014-06-12 ENCOUNTER — Ambulatory Visit (INDEPENDENT_AMBULATORY_CARE_PROVIDER_SITE_OTHER): Payer: Medicare Other | Admitting: Cardiovascular Disease

## 2014-06-12 ENCOUNTER — Encounter: Payer: Self-pay | Admitting: Cardiovascular Disease

## 2014-06-12 VITALS — BP 104/62 | HR 66 | Ht 70.0 in | Wt 140.0 lb

## 2014-06-12 DIAGNOSIS — I359 Nonrheumatic aortic valve disorder, unspecified: Secondary | ICD-10-CM

## 2014-06-12 NOTE — Progress Notes (Signed)
Reason for Consult: Severe aortic stenosis/TAVR Evaluation  HPI:  This is a 80 year old gentleman referred for consideration of transcatheter aortic valve replacement. The patient has long-standing severe aortic stenosis. He has previously been followed by Dr. Lattie Haw, and was recently seen by Dr Harrington Challenger in [facility] [place] retirement.  The patient presents today with his daughter and his caregiver. He has been chronically anticoagulated for atrial fibrillation. The patient has had congestive heart failure and chronic edema. He's had remote coronary bypass grafting in 1994. The patient was hospitalized in November 2013 with pneumonia. His health took a downward turn at that point. He was cared for by hospice and 2014, but was discharged from their care after about 3 months. The patient now leads a very sedentary lifestyle, but he does do a little bit of walking every day. He has excellent support with his daughter who lives next door and his caregiver who is with him nearly 24 hours per day. They prepare his meals and provide transportation. The patient still goes to meetings at his church. He occasionally gets out in the yard but cannot do any work. He is on home oxygen nearly 24/7. Despite that, he denies problems with shortness of breath. He denies chest pain, chest pressure, lightheadedness, or syncope. He is primarily limited by generalized weakness. He also has chronic low back pain.  The patient has been retired for about 30 years. He is a lifelong nonsmoker. He is a widower x 3 years.   Outpatient Encounter Prescriptions as of 06/12/2014  Medication Sig  . dabigatran (PRADAXA) 75 MG CAPS Take 1 capsule (75 mg total) by mouth every 12 (twelve) hours.  Marland Kitchen dextromethorphan-guaiFENesin (MUCINEX DM) 30-600 MG per 12 hr tablet Take 1 tablet by mouth 2 (two) times daily.  Marland Kitchen DIGOX 125 MCG tablet TAKE ONE TABLET DAILY ON MONDAY THROUGH FRIDAY.  Marland Kitchen diltiazem (CARDIZEM CD) 120 MG 24 hr capsule TAKE 1 CAPSULE  BY MOUTH DAILY.  . finasteride (PROSCAR) 5 MG tablet Take 5 mg by mouth daily.   . furosemide (LASIX) 40 MG tablet TAKE 1 AND 1/2 TABLET BY MOUTH TWICE DAILY.  Marland Kitchen HYDROcodone-acetaminophen (VICODIN) 5-500 MG per tablet Take 1 tablet by mouth every 6 (six) hours as needed. Pain   . NITROSTAT 0.4 MG SL tablet PLACE 1 TAB UNDER TONGUE EVERY 5 MIN IF NEEDED FOR CHEST PAIN. MAY USE 3 TIMES.NO RELIEF CALL 911.  . polyethylene glycol powder (GLYCOLAX/MIRALAX) powder   . Sennosides (SENNA LAX PO) Take 2 capsules by mouth daily.   . silodosin (RAPAFLO) 8 MG CAPS capsule Take 8 mg by mouth at bedtime.  Marland Kitchen zolpidem (AMBIEN) 10 MG tablet Take 5 mg by mouth at bedtime as needed for sleep.    Review of patient's allergies indicates no known allergies.  Past Medical History  Diagnosis Date  . Aortic stenosis     +insufficiency ,non-rheumatic; moderate with hyperdynamic LV function in 11/2010  . ASCVD (arteriosclerotic cardiovascular disease)     with angina  . Atrial fibrillation 11/2010  . Abdominal aortic aneurysm     Minimal; maximal diameter of 3.6 cm in 11/2010  . Chronic anticoagulation     Dabigatran  . Oxygen dependent     3 lpm via [place]    Past Surgical History  Procedure Laterality Date  . Coronary artery bypass graft  1994  . Colonoscopy  1997  . Esophagogastroduodenoscopy  11/23/2011    Procedure: ESOPHAGOGASTRODUODENOSCOPY (EGD);  Surgeon: Rogene Houston, MD;  Location: [facility];  Service: Endoscopy;  Laterality: N/A;  100    History   Social History  . Marital Status: Widowed    Spouse Name: N/A    Number of Children: N/A  . Years of Education: N/A   Occupational History  . retired    Social History Main Topics  . Smoking status: Never Smoker   . Smokeless tobacco: Never Used  . Alcohol Use: No  . Drug Use: No  . Sexual Activity: Not on file   Other Topics Concern  . Not on file   Social History Narrative  . No narrative on file    Family History  Problem  Relation Age of Onset  . Parkinsonism Brother   . Hypertension Son     ROS:  General: no fevers/chills/night sweats Eyes: no blurry vision, diplopia, or amaurosis ENT: no sore throat or hearing loss Resp: Positive for cough, negative for hemoptysis CV: See history of present illness GI: no abdominal pain, nausea, vomiting, diarrhea, or constipation GU: Positive for difficulty emptying his bladder and frequent nocturia Skin: no rash Neuro: no headache, numbness, tingling of his extremities. Positive for memory impairment. Musculoskeletal: Positive for low back pain Heme: no bleeding, DVT, or easy bruising Endo: no polydipsia or polyuria  BP 104/62  Pulse 66  Ht 5\' 10"  (1.778 m)  Wt 140 lb (63.504 kg)  BMI 20.09 kg/m2  PHYSICAL EXAM: Pt is alert and oriented, elderly frail-appearing male, in no distress. He is in a wheelchair, on O2 per nasal cannula. HEENT: normal Neck: JVP normal. Carotid upstrokes delayed without bruits. No thyromegaly. Lungs: equal expansion, clear bilaterally CV: Apex is discrete and nondisplaced, irregularly irregular with late peaking harsh grade 3/6 systolic murmur at the left lower sternal border Abd: soft, NT, +BS, no bruit, no hepatosplenomegaly Back: no CVA tenderness Ext: 1+ pretibial edema bilaterally Skin: warm and dry without rash Neuro: CNII-XII intact             Strength intact = bilaterally  2-D echocardiogram: Study Conclusions  - Left ventricle: Systolic function was normal. The estimated ejection fraction was in the range of 55% to 60%. The study was not technically sufficient to allow evaluation of LV diastolic dysfunction due to atrial fibrillation. Moderate to severe concentric LVH. Doppler parameters are consistent with high ventricular filling pressure. - Regional wall motion abnormality: Mild hypokinesis of the mid inferior and mid inferolateral myocardium. - Aortic valve: Moderately to severely calcified  annulus. Moderately thickened, severely calcified leaflets. There was mild regurgitation. Peak velocity (S): 414 cm/s. Severe aortic stenosis. Mean gradient (S): 40 mm Hg. Valve area (VTI): 0.46 cm^2. Valve area (Vmax): 0.44 cm^2. - Mitral valve: Calcified annulus. Mildly thickened leaflets . There was mild regurgitation. - Left atrium: The atrium was moderately dilated. - Right atrium: The atrium was mildly to moderately dilated. - Tricuspid valve: There was mild regurgitation. - Pulmonary arteries: PA peak pressure: 30 mm Hg (S).  RISK SCORES About the STS Risk Calculator Procedure: AV Replacement Risk of Mortality: 11.946% Morbidity or Mortality: 35.735% Long Length of Stay: 23.897% Short Length of Stay: 10.621% Permanent Stroke: 1.892% Prolonged Ventilation: 26.874% DSW Infection: 0.266% Renal Failure: 12.118% Reoperation: 12.179%   ASSESSMENT AND PLAN: 80 year-old with severe calcific aortic stenosis. I had extensive discussion with the patient, his daughter, and his caretaker today with greater than 60 minutes of face-to-face discussion of the natural history, treatment options, and prognosis of severe aortic stenosis. I have personally reviewed his most recent  echocardiogram which clearly demonstrated severe aortic stenosis and preserved LV systolic function. The patients comorbid medical conditions (advanced age, atrial fibrillation, chronic diastolic heart failure, O2 dependence, mild dementia, and poor functional status) clearly place him at prohibitive risk of open cardiac surgery. Therefore, we primarily discussed treatment options of TAVR versus palliative medical therapy and placed this discussion in the context of his specific medical comorbidities. I also discussed his case with his primary physician, Dr Luan Pulling.   In my opinion, the patient is most appropriate for continued medical therapy. His extreme age and limited functional capacity/frailty would likely allow for  only limited benefit from TAVR and his risk of undergoing any procedure under general anesthesia would be significant. Minimum evaluation for TAVR would include CT scans, a cardiac cath, and further office visits with cardiac surgery. After speaking with the patient's daughter in follow-up, she informed me that the patient had extreme exhaustion from his single office visit in [place] with me. They understand that as symptoms progress, he will require a palliative approach. Fortunately, he has remained remarkably stable from a cardiac perspective over the past year and has not suffered from resting shortness of breath, chest pain, or lightheadedness. The patient will continue to follow with Dr Luan Pulling and Dr Harrington Challenger.   Time spent conducting this evaluation > 90 minutes, over half of which was spent in direct discussion with the patient and his family as outlined above.   Sherren Mocha 06/13/2014 4:30 PM

## 2014-06-14 ENCOUNTER — Encounter: Payer: Self-pay | Admitting: Cardiovascular Disease

## 2014-06-30 ENCOUNTER — Telehealth: Payer: Self-pay | Admitting: *Deleted

## 2014-06-30 NOTE — Telephone Encounter (Signed)
pradaxa 75 mg 2 boes samples given

## 2014-06-30 NOTE — Telephone Encounter (Signed)
pradaxa 75 mg samples

## 2014-07-15 ENCOUNTER — Telehealth: Payer: Self-pay | Admitting: *Deleted

## 2014-07-15 NOTE — Telephone Encounter (Signed)
Spoke to [place] (daughter) left samples of pradaxa in  front for pickup

## 2014-07-15 NOTE — Telephone Encounter (Signed)
pradaxa samples

## 2014-07-29 ENCOUNTER — Telehealth: Payer: Self-pay

## 2014-07-29 NOTE — Telephone Encounter (Signed)
2 boxes pradaxa samples given to pt's daughter

## 2014-07-30 ENCOUNTER — Telehealth: Payer: Self-pay | Admitting: Cardiovascular Disease

## 2014-07-30 MED ORDER — DABIGATRAN ETEXILATE MESYLATE 75 MG PO CAPS: 75.0000 mg | ORAL_CAPSULE | Freq: Two times a day (BID) | ORAL | Status: DC

## 2014-07-30 NOTE — Telephone Encounter (Signed)
Needs RX for Pradaxa 75 mg sent to [facility] / [facility]

## 2014-07-30 NOTE — Telephone Encounter (Signed)
Medication sent to pharmacy  

## 2014-08-01 IMAGING — CR DG CHEST 2V
2 series · 2 of 2 positions shown · non-contrast
Comparison: 02/25/2013 and 10/03/2012. CT chest 11/24/2010

CLINICAL DATA: Cough and weakness

EXAM:
CHEST  2 VIEW

[view not recorded (1 of 2)]
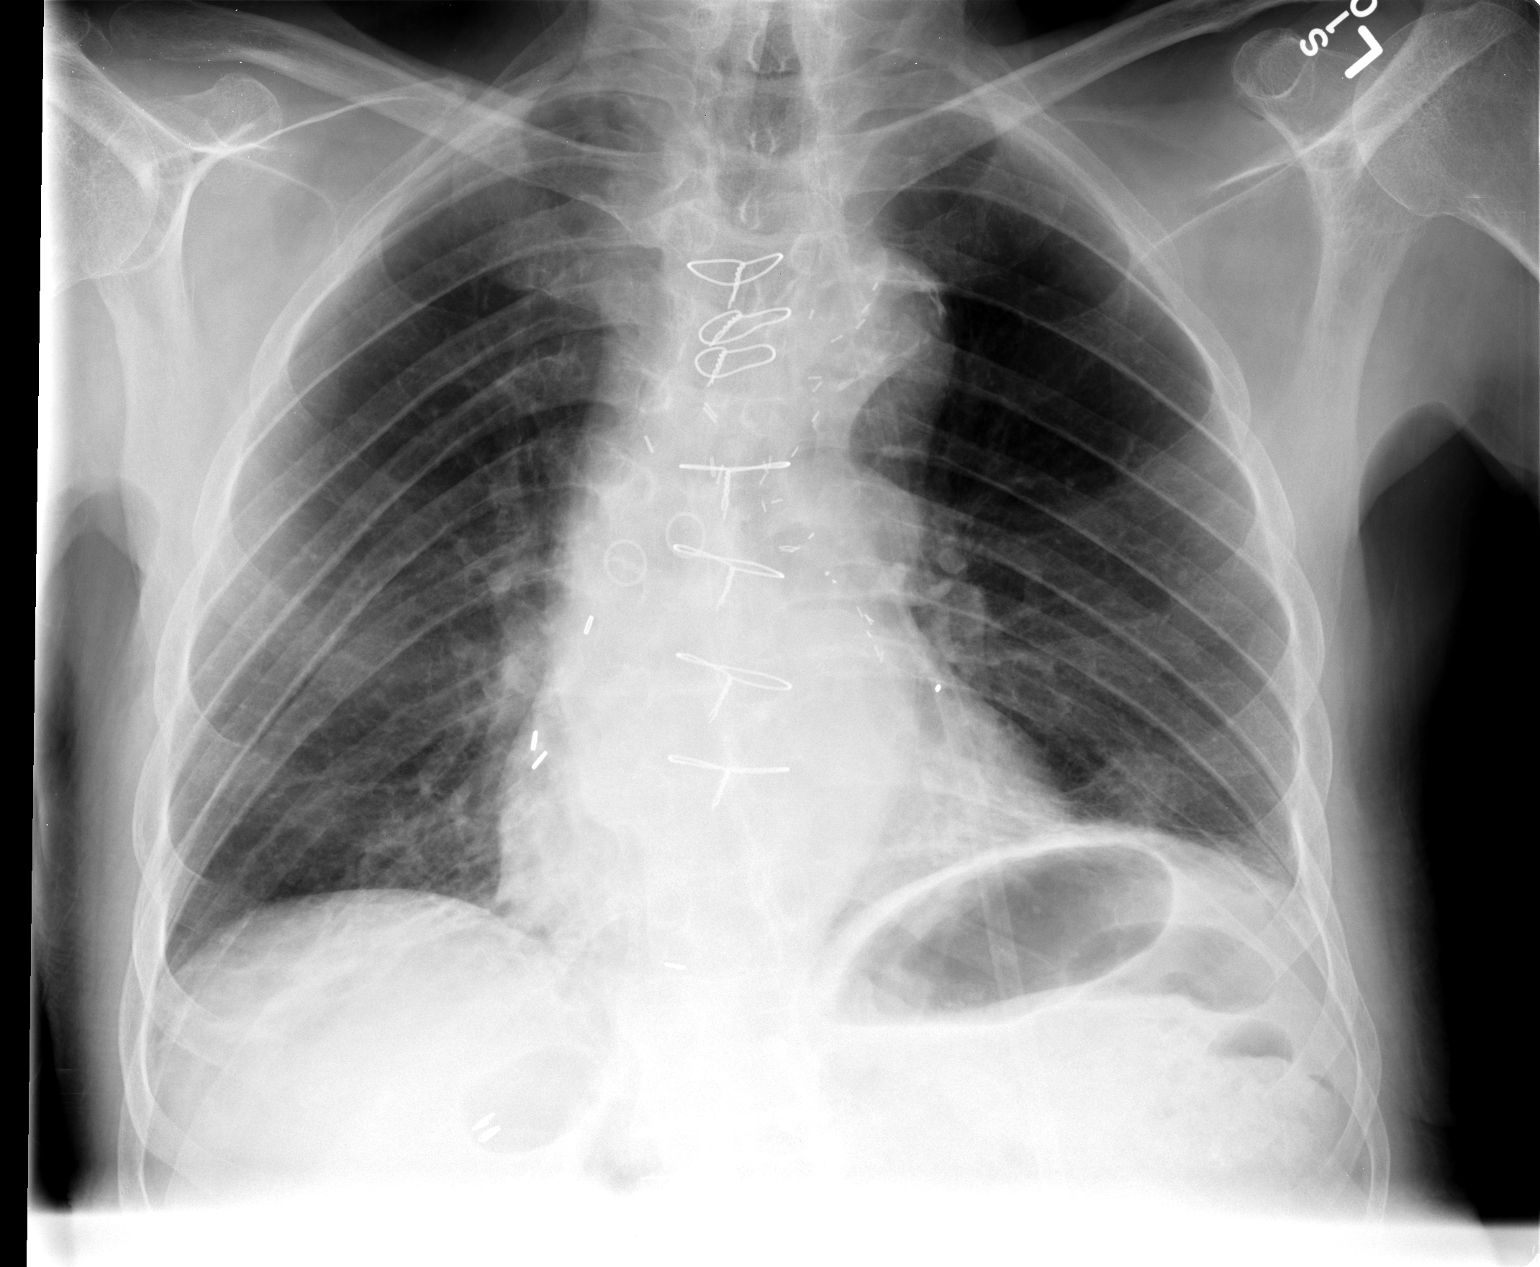

[view not recorded (2 of 2)]
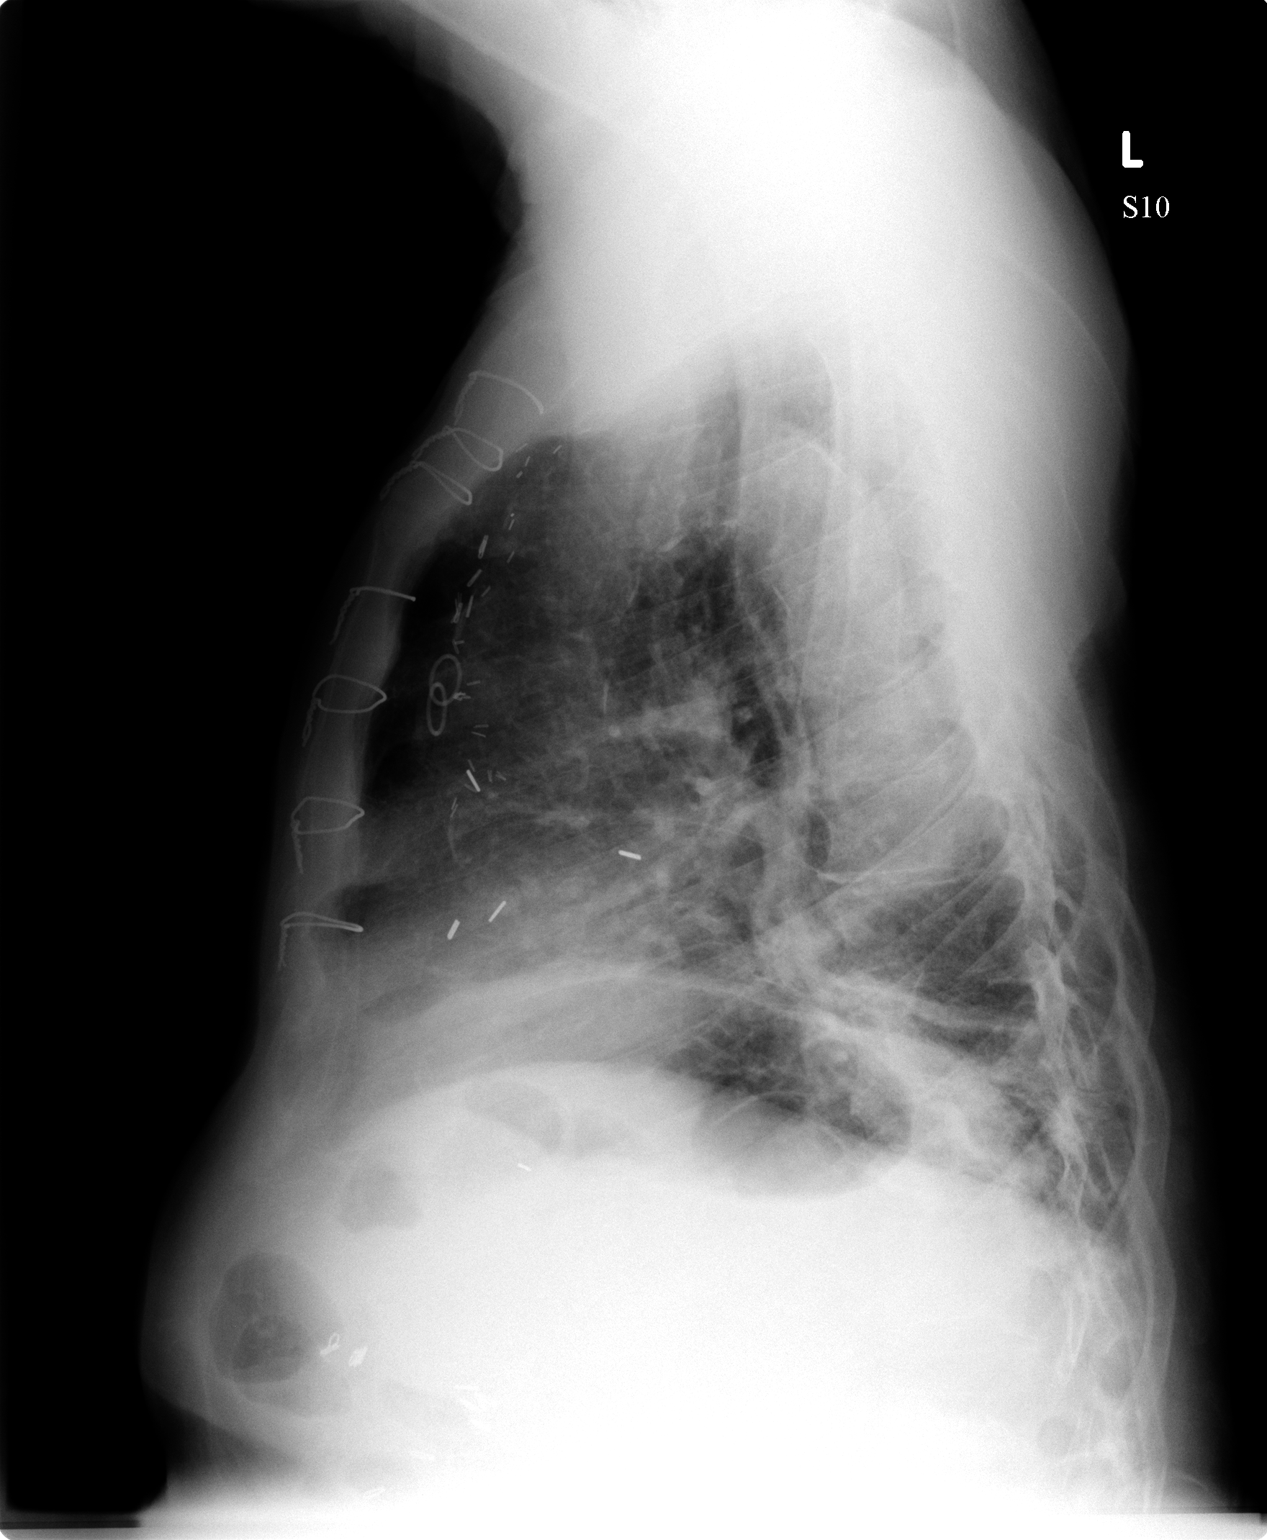

[2 of 2 positions shown; findings below may reference images not displayed]

FINDINGS: There changes of median sternotomy for CABG. Cardiomegaly is stable.
Tortuous thoracic aorta contour with atherosclerotic calcification
of the aortic arch is noted and stable. Pulmonary vascularity is
normal. Peribronchial thickening and streaky opacities are noted in
the right lower lobe. .
IMPRESSION: Peribronchial thickening and streaky opacities in the right lower
lobe. Findings could reflect up bronchitis and/or pneumonia.

## 2014-08-18 ENCOUNTER — Telehealth: Payer: Self-pay | Admitting: *Deleted

## 2014-08-18 NOTE — Telephone Encounter (Signed)
Pt daughter notified that samples of pradaxa 75 mg ready for pickup

## 2014-08-18 NOTE — Telephone Encounter (Signed)
pradaxa 75 mg samples

## 2014-10-06 ENCOUNTER — Other Ambulatory Visit: Payer: Self-pay | Admitting: Internal Medicine

## 2014-10-20 ENCOUNTER — Telehealth: Payer: Self-pay | Admitting: *Deleted

## 2014-10-20 NOTE — Telephone Encounter (Signed)
No 75 mg Pradaxa samples available,LM for pt

## 2014-10-20 NOTE — Telephone Encounter (Signed)
Samples of pradaxa 75 mg

## 2014-11-10 ENCOUNTER — Other Ambulatory Visit: Payer: Self-pay | Admitting: Internal Medicine

## 2014-11-21 DIAGNOSIS — J449 Chronic obstructive pulmonary disease, unspecified: Secondary | ICD-10-CM | POA: Diagnosis not present

## 2014-11-21 DIAGNOSIS — I5040 Unspecified combined systolic (congestive) and diastolic (congestive) heart failure: Secondary | ICD-10-CM | POA: Diagnosis not present

## 2014-11-24 ENCOUNTER — Telehealth: Payer: Self-pay | Admitting: *Deleted

## 2014-11-24 NOTE — Telephone Encounter (Signed)
PRADAXA 75 MG SAMPLES

## 2014-12-02 DIAGNOSIS — I110 Hypertensive heart disease with heart failure: Secondary | ICD-10-CM | POA: Diagnosis not present

## 2014-12-02 DIAGNOSIS — N401 Enlarged prostate with lower urinary tract symptoms: Secondary | ICD-10-CM | POA: Diagnosis not present

## 2014-12-02 DIAGNOSIS — I2510 Atherosclerotic heart disease of native coronary artery without angina pectoris: Secondary | ICD-10-CM | POA: Diagnosis not present

## 2014-12-02 DIAGNOSIS — I350 Nonrheumatic aortic (valve) stenosis: Secondary | ICD-10-CM | POA: Diagnosis not present

## 2014-12-11 ENCOUNTER — Telehealth: Payer: Self-pay | Admitting: *Deleted

## 2014-12-11 NOTE — Telephone Encounter (Signed)
PT NEEDS PRADAXA 75 MG SAMPLES

## 2014-12-11 NOTE — Telephone Encounter (Signed)
No samples available,pt pays $45 month per month for drug per daughter

## 2014-12-22 DIAGNOSIS — I5040 Unspecified combined systolic (congestive) and diastolic (congestive) heart failure: Secondary | ICD-10-CM | POA: Diagnosis not present

## 2014-12-22 DIAGNOSIS — J449 Chronic obstructive pulmonary disease, unspecified: Secondary | ICD-10-CM | POA: Diagnosis not present

## 2015-01-05 ENCOUNTER — Telehealth: Payer: Self-pay | Admitting: *Deleted

## 2015-01-05 NOTE — Telephone Encounter (Signed)
They have questions about Bp running low.

## 2015-01-06 NOTE — Telephone Encounter (Signed)
Patient's daughter states that on yesterday pt's BP was 82/42 Hr 88. Sat. BP was 100/68 Hr 80, 86/50 Hr 98, 86/50. Pt denies dizziness.  Please advise.

## 2015-01-07 NOTE — Telephone Encounter (Signed)
Daughter notified. All questions answered. Daughter voiced understanding

## 2015-01-07 NOTE — Telephone Encounter (Signed)
Spoke with daughter yesterday   She had called with BP for dad  81s Dropped off list of readings   I have reviewed with L [facility].  One in 17s  Otherwise mostly in 90s  I would keep on same meds  Patinet seen by me last summer  ONce by Ezzie Dural  Since I am not coming to [place] often I would recomm that patient establish with [facility] in near future Will try to arrange f/u  Please notify daughter of above

## 2015-01-09 ENCOUNTER — Encounter: Payer: Self-pay | Admitting: Internal Medicine

## 2015-01-13 DIAGNOSIS — I350 Nonrheumatic aortic (valve) stenosis: Secondary | ICD-10-CM | POA: Diagnosis not present

## 2015-01-13 DIAGNOSIS — J9691 Respiratory failure, unspecified with hypoxia: Secondary | ICD-10-CM | POA: Diagnosis not present

## 2015-01-13 DIAGNOSIS — I110 Hypertensive heart disease with heart failure: Secondary | ICD-10-CM | POA: Diagnosis not present

## 2015-01-13 DIAGNOSIS — N401 Enlarged prostate with lower urinary tract symptoms: Secondary | ICD-10-CM | POA: Diagnosis not present

## 2015-01-20 DIAGNOSIS — I5040 Unspecified combined systolic (congestive) and diastolic (congestive) heart failure: Secondary | ICD-10-CM | POA: Diagnosis not present

## 2015-01-20 DIAGNOSIS — J449 Chronic obstructive pulmonary disease, unspecified: Secondary | ICD-10-CM | POA: Diagnosis not present

## 2015-01-26 DIAGNOSIS — L570 Actinic keratosis: Secondary | ICD-10-CM | POA: Diagnosis not present

## 2015-01-26 DIAGNOSIS — X32XXXD Exposure to sunlight, subsequent encounter: Secondary | ICD-10-CM | POA: Diagnosis not present

## 2015-01-26 DIAGNOSIS — C4441 Basal cell carcinoma of skin of scalp and neck: Secondary | ICD-10-CM | POA: Diagnosis not present

## 2015-02-20 DIAGNOSIS — J449 Chronic obstructive pulmonary disease, unspecified: Secondary | ICD-10-CM | POA: Diagnosis not present

## 2015-02-20 DIAGNOSIS — I5040 Unspecified combined systolic (congestive) and diastolic (congestive) heart failure: Secondary | ICD-10-CM | POA: Diagnosis not present

## 2015-02-23 ENCOUNTER — Ambulatory Visit (INDEPENDENT_AMBULATORY_CARE_PROVIDER_SITE_OTHER): Payer: Medicare Other | Admitting: Cardiovascular Disease

## 2015-02-23 ENCOUNTER — Encounter: Payer: Self-pay | Admitting: Cardiovascular Disease

## 2015-02-23 VITALS — BP 102/62 | HR 67 | Ht 69.0 in | Wt 142.4 lb

## 2015-02-23 DIAGNOSIS — I5032 Chronic diastolic (congestive) heart failure: Secondary | ICD-10-CM | POA: Diagnosis not present

## 2015-02-23 DIAGNOSIS — Z7901 Long term (current) use of anticoagulants: Secondary | ICD-10-CM | POA: Diagnosis not present

## 2015-02-23 DIAGNOSIS — I482 Chronic atrial fibrillation, unspecified: Secondary | ICD-10-CM

## 2015-02-23 DIAGNOSIS — I350 Nonrheumatic aortic (valve) stenosis: Secondary | ICD-10-CM | POA: Diagnosis not present

## 2015-02-23 DIAGNOSIS — R600 Localized edema: Secondary | ICD-10-CM

## 2015-02-23 DIAGNOSIS — I25810 Atherosclerosis of coronary artery bypass graft(s) without angina pectoris: Secondary | ICD-10-CM

## 2015-02-23 NOTE — Patient Instructions (Signed)
Your physician recommends that you schedule a follow-up appointment in: September with Dr Bronson Ing   Your physician recommends that you continue on your current medications as directed. Please refer to the Current Medication list given to you today.      Thank you for choosing [facility] !

## 2015-02-23 NOTE — Progress Notes (Signed)
Patient ID: Troy Hinton, male   DOB: 10-13-1917, 81 y.o.   MRN: [id number]      SUBJECTIVE: The patient is a 81 year old male who I am evaluating for the first time. He most recently saw Dr. Burt Knack in July 2015 for consideration for transcatheter aortic valve replacement. It was recommended that a palliative care approach be pursued given his age and frailty. He also has a history of atrial fibrillation and is anticoagulated with Pradaxa. He has a history of congestive heart failure and chronic edema. He underwent coronary artery bypass grafting in 1994. His daughter, [name], lives next door and he has a caregiver Butch Penny) who is with him nearly 24 hours a day, and is like family to he and his daughter and is a Quarry manager. 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 He is a lifelong nonsmoker. He is a widower.  He denies chest pain. He may get short of breath if he overexerts himself. He has chronic leg swelling for which he takes Lasix and wears compression stockings. He denies syncope.  He occasionally has low systolic blood pressure readings in the 80 mmHg range, but he is asymptomatic with this.  ECG performed in the office today demonstrates atrial fibrillation with a nonspecific T wave abnormality, heart rate 88 bpm.  Review of Systems: As per "subjective", otherwise negative.  No Known Allergies  Current Outpatient Prescriptions  Medication Sig Dispense Refill  . dabigatran (PRADAXA) 75 MG CAPS capsule Take 1 capsule (75 mg total) by mouth every 12 (twelve) hours. 60 capsule 6  . dextromethorphan-guaiFENesin (MUCINEX DM) 30-600 MG per 12 hr tablet Take 1 tablet by mouth 2 (two) times daily.    Marland Kitchen DIGOX 125 MCG tablet TAKE ONE TABLET DAILY ON MONDAY THROUGH FRIDAY. 30 tablet 6  . diltiazem (CARDIZEM CD) 120 MG 24 hr capsule TAKE 1 CAPSULE BY MOUTH DAILY. 30  capsule 3  . finasteride (PROSCAR) 5 MG tablet Take 5 mg by mouth daily.     . furosemide (LASIX) 40 MG tablet TAKE 1 AND 1/2 TABLETS BY MOUTH TWICE DAILY. 90 tablet 6  . HYDROcodone-acetaminophen (VICODIN) 5-500 MG per tablet Take 1 tablet by mouth every 6 (six) hours as needed. Pain     . NITROSTAT 0.4 MG SL tablet PLACE 1 TAB UNDER TONGUE EVERY 5 MIN IF NEEDED FOR CHEST PAIN. MAY USE 3 TIMES.NO RELIEF CALL 911. 25 tablet 1  . polyethylene glycol powder (GLYCOLAX/MIRALAX) powder     . Sennosides (SENNA LAX PO) Take 2 capsules by mouth daily.     . silodosin (RAPAFLO) 8 MG CAPS capsule Take 8 mg by mouth at bedtime.    Marland Kitchen zolpidem (AMBIEN) 10 MG tablet Take 5 mg by mouth at bedtime as needed for sleep.     No current facility-administered medications for this visit.    Past Medical History  Diagnosis Date  . Aortic stenosis     +insufficiency ,non-rheumatic; moderate with hyperdynamic LV function in 11/2010  . ASCVD (arteriosclerotic cardiovascular disease)     with angina  . Atrial fibrillation 11/2010  . Abdominal aortic aneurysm     Minimal; maximal diameter of 3.6 cm in 11/2010  . Chronic anticoagulation     Dabigatran  . Oxygen dependent     3 lpm via [place]    Past Surgical History  Procedure Laterality Date  .  Coronary artery bypass graft  1994  . Colonoscopy  1997  . Esophagogastroduodenoscopy  11/23/2011    Procedure: ESOPHAGOGASTRODUODENOSCOPY (EGD);  Surgeon: Rogene Houston, MD;  Location: [facility];  Service: Endoscopy;  Laterality: N/A;  100    History   Social History  . Marital Status: Widowed    Spouse Name: N/A  . Number of Children: N/A  . Years of Education: N/A   Occupational History  . retired    Social History Main Topics  . Smoking status: Never Smoker   . Smokeless tobacco: Never Used  . Alcohol Use: No  . Drug Use: No  . Sexual Activity: Not on file   Other Topics Concern  . Not on file   Social History Narrative     Filed Vitals:    02/23/15 1043  BP: 102/62  Pulse: 67  Height: 5\' 9"  (1.753 m)  Weight: 142 lb 6.4 oz (64.592 kg)  SpO2: 95%    PHYSICAL EXAM General: NAD, elderly, frail HEENT: Normal. Neck: No JVD, no thyromegaly. Lungs: Clear to auscultation bilaterally with normal respiratory effort. CV: Nondisplaced PMI.  Irregular rhythm, normal S1/S2, no S3, harsh late-peaking 3/6 systolic murmur over RUSB. No pretibial and trivial periankle edema.  No carotid bruit.   Abdomen: Soft, nontender, no distention.  Neurologic: Alert and oriented x 3.  Psych: Normal affect. Skin: Normal. Musculoskeletal: No gross deformities. Extremities: No clubbing or cyanosis.   ECG: Most recent ECG reviewed.      ASSESSMENT AND PLAN: 1. Severe aortic stenosis: Palliative care approach is being pursued with symptom management. Continue Lasix 60 mg bid for associated chronic diastolic heart failure. Continue adequate HR control with diltiazem and digoxin.  2. CAD with CABG: Symptomatically stable. No changes to therapy.  3. Atrial fibrillation: Anticoagulated with Pradaxa. Continue adequate HR control with diltiazem and digoxin.   4. Chronic diastolic heart failure: Euvolemic. Continue Lasix 60 mg bid and instructed to monitor for weight gain of 3 lbs in 24 hours or increasing shortness of breath and leg swelling. Continue compression stocking use and HR control.  Dispo: f/u in September 2016.  Time spent: 40 minutes, of which greater than 50% was spent reviewing symptoms, relevant blood tests and studies, and discussing management plan with the patient.  Kate Sable, M.D., F.A.C.C.

## 2015-03-02 DIAGNOSIS — Z08 Encounter for follow-up examination after completed treatment for malignant neoplasm: Secondary | ICD-10-CM | POA: Diagnosis not present

## 2015-03-02 DIAGNOSIS — L570 Actinic keratosis: Secondary | ICD-10-CM | POA: Diagnosis not present

## 2015-03-02 DIAGNOSIS — X32XXXD Exposure to sunlight, subsequent encounter: Secondary | ICD-10-CM | POA: Diagnosis not present

## 2015-03-02 DIAGNOSIS — L918 Other hypertrophic disorders of the skin: Secondary | ICD-10-CM | POA: Diagnosis not present

## 2015-03-02 DIAGNOSIS — Z85828 Personal history of other malignant neoplasm of skin: Secondary | ICD-10-CM | POA: Diagnosis not present

## 2015-03-18 DIAGNOSIS — L918 Other hypertrophic disorders of the skin: Secondary | ICD-10-CM | POA: Diagnosis not present

## 2015-03-18 DIAGNOSIS — B353 Tinea pedis: Secondary | ICD-10-CM | POA: Diagnosis not present

## 2015-03-18 DIAGNOSIS — X32XXXD Exposure to sunlight, subsequent encounter: Secondary | ICD-10-CM | POA: Diagnosis not present

## 2015-03-18 DIAGNOSIS — L570 Actinic keratosis: Secondary | ICD-10-CM | POA: Diagnosis not present

## 2015-03-22 DIAGNOSIS — I5040 Unspecified combined systolic (congestive) and diastolic (congestive) heart failure: Secondary | ICD-10-CM | POA: Diagnosis not present

## 2015-03-22 DIAGNOSIS — J449 Chronic obstructive pulmonary disease, unspecified: Secondary | ICD-10-CM | POA: Diagnosis not present

## 2015-04-15 DIAGNOSIS — I2510 Atherosclerotic heart disease of native coronary artery without angina pectoris: Secondary | ICD-10-CM | POA: Diagnosis not present

## 2015-04-15 DIAGNOSIS — I110 Hypertensive heart disease with heart failure: Secondary | ICD-10-CM | POA: Diagnosis not present

## 2015-04-15 DIAGNOSIS — I4891 Unspecified atrial fibrillation: Secondary | ICD-10-CM | POA: Diagnosis not present

## 2015-04-15 DIAGNOSIS — I350 Nonrheumatic aortic (valve) stenosis: Secondary | ICD-10-CM | POA: Diagnosis not present

## 2015-04-22 DIAGNOSIS — J449 Chronic obstructive pulmonary disease, unspecified: Secondary | ICD-10-CM | POA: Diagnosis not present

## 2015-04-22 DIAGNOSIS — I5040 Unspecified combined systolic (congestive) and diastolic (congestive) heart failure: Secondary | ICD-10-CM | POA: Diagnosis not present

## 2015-05-11 ENCOUNTER — Other Ambulatory Visit: Payer: Self-pay

## 2015-05-20 ENCOUNTER — Telehealth: Payer: Self-pay | Admitting: Cardiovascular Disease

## 2015-05-20 NOTE — Telephone Encounter (Signed)
pls call Caren Griffins concerning the pt

## 2015-05-20 NOTE — Telephone Encounter (Signed)
Pt wanted to schedule October apt but schedule not open yet open.she also wanted pradaxa samples which I do not have currently

## 2015-05-22 DIAGNOSIS — J449 Chronic obstructive pulmonary disease, unspecified: Secondary | ICD-10-CM | POA: Diagnosis not present

## 2015-05-22 DIAGNOSIS — I5040 Unspecified combined systolic (congestive) and diastolic (congestive) heart failure: Secondary | ICD-10-CM | POA: Diagnosis not present

## 2015-05-27 ENCOUNTER — Other Ambulatory Visit: Payer: Self-pay | Admitting: Cardiovascular Disease

## 2015-06-05 ENCOUNTER — Other Ambulatory Visit: Payer: Self-pay | Admitting: Internal Medicine

## 2015-06-09 ENCOUNTER — Other Ambulatory Visit: Payer: Self-pay

## 2015-06-22 DIAGNOSIS — J449 Chronic obstructive pulmonary disease, unspecified: Secondary | ICD-10-CM | POA: Diagnosis not present

## 2015-06-22 DIAGNOSIS — I5040 Unspecified combined systolic (congestive) and diastolic (congestive) heart failure: Secondary | ICD-10-CM | POA: Diagnosis not present

## 2015-07-16 DIAGNOSIS — Z23 Encounter for immunization: Secondary | ICD-10-CM | POA: Diagnosis not present

## 2015-07-16 DIAGNOSIS — Z0000 Encounter for general adult medical examination without abnormal findings: Secondary | ICD-10-CM | POA: Diagnosis not present

## 2015-07-23 DIAGNOSIS — I5040 Unspecified combined systolic (congestive) and diastolic (congestive) heart failure: Secondary | ICD-10-CM | POA: Diagnosis not present

## 2015-07-23 DIAGNOSIS — J449 Chronic obstructive pulmonary disease, unspecified: Secondary | ICD-10-CM | POA: Diagnosis not present

## 2015-08-22 DIAGNOSIS — I5040 Unspecified combined systolic (congestive) and diastolic (congestive) heart failure: Secondary | ICD-10-CM | POA: Diagnosis not present

## 2015-08-22 DIAGNOSIS — J449 Chronic obstructive pulmonary disease, unspecified: Secondary | ICD-10-CM | POA: Diagnosis not present

## 2015-09-22 DIAGNOSIS — I5040 Unspecified combined systolic (congestive) and diastolic (congestive) heart failure: Secondary | ICD-10-CM | POA: Diagnosis not present

## 2015-09-22 DIAGNOSIS — J449 Chronic obstructive pulmonary disease, unspecified: Secondary | ICD-10-CM | POA: Diagnosis not present

## 2015-10-06 ENCOUNTER — Telehealth: Payer: Self-pay | Admitting: Cardiovascular Disease

## 2015-10-06 NOTE — Telephone Encounter (Signed)
Pt had sx of left arm from shoulder to finger tingling sensation last night lasted 5 minutes,this occurred 3 times in the last week  Daughter says he has history of scoliosis and thinks it can be nerve pain.   She wanted your opinion

## 2015-10-06 NOTE — Telephone Encounter (Signed)
Patient's daughter would like to speak with nurse regarding symptoms patient is having. / tg

## 2015-10-07 NOTE — Telephone Encounter (Signed)
Spoke with daughter,she agrees

## 2015-10-07 NOTE — Telephone Encounter (Signed)
Sounds likely.

## 2015-10-20 DIAGNOSIS — I110 Hypertensive heart disease with heart failure: Secondary | ICD-10-CM | POA: Diagnosis not present

## 2015-10-20 DIAGNOSIS — N189 Chronic kidney disease, unspecified: Secondary | ICD-10-CM | POA: Diagnosis not present

## 2015-10-20 DIAGNOSIS — I2510 Atherosclerotic heart disease of native coronary artery without angina pectoris: Secondary | ICD-10-CM | POA: Diagnosis not present

## 2015-10-22 DIAGNOSIS — I5040 Unspecified combined systolic (congestive) and diastolic (congestive) heart failure: Secondary | ICD-10-CM | POA: Diagnosis not present

## 2015-10-22 DIAGNOSIS — J449 Chronic obstructive pulmonary disease, unspecified: Secondary | ICD-10-CM | POA: Diagnosis not present

## 2015-10-29 DIAGNOSIS — L8990 Pressure ulcer of unspecified site, unspecified stage: Secondary | ICD-10-CM | POA: Diagnosis not present

## 2015-10-29 DIAGNOSIS — L570 Actinic keratosis: Secondary | ICD-10-CM | POA: Diagnosis not present

## 2015-10-29 DIAGNOSIS — L820 Inflamed seborrheic keratosis: Secondary | ICD-10-CM | POA: Diagnosis not present

## 2015-10-29 DIAGNOSIS — X32XXXD Exposure to sunlight, subsequent encounter: Secondary | ICD-10-CM | POA: Diagnosis not present

## 2015-11-22 DIAGNOSIS — J449 Chronic obstructive pulmonary disease, unspecified: Secondary | ICD-10-CM | POA: Diagnosis not present

## 2015-11-22 DIAGNOSIS — I5040 Unspecified combined systolic (congestive) and diastolic (congestive) heart failure: Secondary | ICD-10-CM | POA: Diagnosis not present

## 2015-12-15 ENCOUNTER — Encounter: Payer: Medicare Other | Admitting: Adult Health

## 2015-12-15 NOTE — Progress Notes (Signed)
Cardiology Office Note   ERROR Cancelled Appointment 

## 2015-12-23 DIAGNOSIS — J449 Chronic obstructive pulmonary disease, unspecified: Secondary | ICD-10-CM | POA: Diagnosis not present

## 2015-12-23 DIAGNOSIS — I5040 Unspecified combined systolic (congestive) and diastolic (congestive) heart failure: Secondary | ICD-10-CM | POA: Diagnosis not present

## 2015-12-31 DIAGNOSIS — L8990 Pressure ulcer of unspecified site, unspecified stage: Secondary | ICD-10-CM | POA: Diagnosis not present

## 2016-01-19 DIAGNOSIS — I4891 Unspecified atrial fibrillation: Secondary | ICD-10-CM | POA: Diagnosis not present

## 2016-01-19 DIAGNOSIS — M545 Low back pain: Secondary | ICD-10-CM | POA: Diagnosis not present

## 2016-01-19 DIAGNOSIS — I110 Hypertensive heart disease with heart failure: Secondary | ICD-10-CM | POA: Diagnosis not present

## 2016-01-19 DIAGNOSIS — I2510 Atherosclerotic heart disease of native coronary artery without angina pectoris: Secondary | ICD-10-CM | POA: Diagnosis not present

## 2016-01-19 DIAGNOSIS — I350 Nonrheumatic aortic (valve) stenosis: Secondary | ICD-10-CM | POA: Diagnosis not present

## 2016-01-20 DIAGNOSIS — J449 Chronic obstructive pulmonary disease, unspecified: Secondary | ICD-10-CM | POA: Diagnosis not present

## 2016-01-20 DIAGNOSIS — I5040 Unspecified combined systolic (congestive) and diastolic (congestive) heart failure: Secondary | ICD-10-CM | POA: Diagnosis not present

## 2016-01-21 ENCOUNTER — Telehealth: Payer: Self-pay | Admitting: Internal Medicine

## 2016-01-21 NOTE — Telephone Encounter (Signed)
Pt's daughter stated that the pt is having trouble walking and that he did not recognize his home health nurse that stays there with him at night.

## 2016-01-21 NOTE — Telephone Encounter (Signed)
82 yr old pt,will forward to Dr Bronson Ing for advice

## 2016-01-22 NOTE — Telephone Encounter (Signed)
Please see note again from daughter again

## 2016-01-22 NOTE — Telephone Encounter (Signed)
Suggest she contact PCP for evaluation.

## 2016-01-22 NOTE — Telephone Encounter (Signed)
Patient's daughter rec'd Cathey's voicemail and called to let us know that they saw Dr. Luan Pulling on Tuesday, 01/19/16. Dr. Luan Pulling is aware but did not address these problems.  His inability to walk has happened twice in the past two weeks. Patient has an appointment with Dr. Harrington Challenger in April but they're wondering if there's anything they can do in the mean time to help him.

## 2016-01-22 NOTE — Telephone Encounter (Signed)
LM on daughters voicemail to go see pcp

## 2016-01-22 NOTE — Telephone Encounter (Signed)
Daughter has  fu with you in [place] and will discuss then

## 2016-02-02 ENCOUNTER — Ambulatory Visit (INDEPENDENT_AMBULATORY_CARE_PROVIDER_SITE_OTHER): Payer: Medicare Other | Admitting: Cardiovascular Disease

## 2016-02-02 VITALS — BP 110/56 | HR 69 | Ht 69.0 in | Wt 139.0 lb

## 2016-02-02 DIAGNOSIS — I4891 Unspecified atrial fibrillation: Secondary | ICD-10-CM

## 2016-02-02 DIAGNOSIS — I482 Chronic atrial fibrillation, unspecified: Secondary | ICD-10-CM

## 2016-02-02 DIAGNOSIS — E782 Mixed hyperlipidemia: Secondary | ICD-10-CM

## 2016-02-02 DIAGNOSIS — I350 Nonrheumatic aortic (valve) stenosis: Secondary | ICD-10-CM

## 2016-02-02 DIAGNOSIS — I5032 Chronic diastolic (congestive) heart failure: Secondary | ICD-10-CM | POA: Diagnosis not present

## 2016-02-02 DIAGNOSIS — R600 Localized edema: Secondary | ICD-10-CM

## 2016-02-02 DIAGNOSIS — I25810 Atherosclerosis of coronary artery bypass graft(s) without angina pectoris: Secondary | ICD-10-CM

## 2016-02-02 DIAGNOSIS — Z7901 Long term (current) use of anticoagulants: Secondary | ICD-10-CM

## 2016-02-02 NOTE — Patient Instructions (Signed)
Continue all current medications. Your physician wants you to follow up in: 6 months.  You will receive a reminder letter in the mail one-two months in advance.  If you don't receive a letter, please call our office to schedule the follow up appointment   

## 2016-02-02 NOTE — Progress Notes (Signed)
Patient ID: Troy Hinton, male   DOB: 1917/04/30, 82 y.o.   MRN: [id number]      SUBJECTIVE: The patient presents for follow-up for severe, inoperable aortic stenosis, coronary artery disease with CABG, atrial fibrillation, and chronic diastolic heart failure. I last saw him in April 2016.  His daughter, [name], lives next door and he has a caregiver Butch Penny) who is with him nearly 24 hours a day, and is like family to he and his daughter and is a Quarry manager. 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  He denies chest pain. He sometimes has shortness of breath. He has chronic leg swelling for which he takes Lasix and wears compression stockings. He denies syncope.  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 He is a lifelong nonsmoker. He is a widower.  Review of Systems: As per "subjective", otherwise negative.  No Known Allergies  Current Outpatient Prescriptions  Medication Sig Dispense Refill  . apixaban (ELIQUIS) 2.5 MG TABS tablet Take 2.5 mg by mouth 2 (two) times daily.    Marland Kitchen dextromethorphan-guaiFENesin (MUCINEX DM) 30-600 MG per 12 hr tablet Take 1 tablet by mouth 2 (two) times daily.    Marland Kitchen DIGOX 125 MCG tablet TAKE ONE TABLET DAILY ON MONDAY THROUGH FRIDAY. 30 tablet 6  . diltiazem (CARDIZEM CD) 120 MG 24 hr capsule TAKE 1 CAPSULE BY MOUTH DAILY. 30 capsule 3  . finasteride (PROSCAR) 5 MG tablet Take 5 mg by mouth daily.     . furosemide (LASIX) 40 MG tablet TAKE 1 AND 1/2 TABLETS BY MOUTH TWICE DAILY. 90 tablet 0  . HYDROcodone-acetaminophen (VICODIN) 5-500 MG per tablet Take 1 tablet by mouth every 6 (six) hours as needed. Pain     . NITROSTAT 0.4 MG SL tablet PLACE 1 TAB UNDER TONGUE EVERY 5 MIN IF NEEDED FOR CHEST PAIN. MAY USE 3 TIMES.NO RELIEF CALL 911. 25 tablet 1  . polyethylene glycol powder (GLYCOLAX/MIRALAX) powder     . Sennosides (SENNA LAX PO) Take 2 capsules by mouth daily.      Marland Kitchen zolpidem (AMBIEN CR) 12.5 MG CR tablet Take 12.5 mg by mouth at bedtime.     No current facility-administered medications for this visit.    Past Medical History  Diagnosis Date  . Aortic stenosis     +insufficiency ,non-rheumatic; moderate with hyperdynamic LV function in 11/2010  . ASCVD (arteriosclerotic cardiovascular disease)     with angina  . Atrial fibrillation 11/2010  . Abdominal aortic aneurysm     Minimal; maximal diameter of 3.6 cm in 11/2010  . Chronic anticoagulation     Dabigatran  . Oxygen dependent     3 lpm via [place]    Past Surgical History  Procedure Laterality Date  . Coronary artery bypass graft  1994  . Colonoscopy  1997  . Esophagogastroduodenoscopy  11/23/2011    Procedure: ESOPHAGOGASTRODUODENOSCOPY (EGD);  Surgeon: Rogene Houston, MD;  Location: [facility];  Service: Endoscopy;  Laterality: N/A;  100    Social History   Social History  . Marital Status: Widowed    Spouse Name: N/A  . Number of Children: N/A  . Years of Education: N/A   Occupational History  . retired    Social History Main Topics  . Smoking status: Never Smoker   . Smokeless tobacco: Never Used  . Alcohol Use: No  . Drug Use: No  .  Sexual Activity: Not on file   Other Topics Concern  . Not on file   Social History Narrative     Filed Vitals:   02/02/16 1535  BP: 110/56  Pulse: 69  Height: 5\' 9"  (1.753 m)  Weight: 139 lb (63.05 kg)  SpO2: 93%    PHYSICAL EXAM General: NAD, elderly, frail HEENT: Normal. Neck: No JVD, no thyromegaly. Lungs: Clear to auscultation bilaterally with normal respiratory effort. CV: Nondisplaced PMI. Irregular rhythm, normal S1/S2, no S3, harsh late-peaking 3/6 systolic murmur over RUSB. No pretibial and trivial periankle edema. No carotid bruit.  Abdomen: Soft, nontender, no distention.  Neurologic: Alert and oriented x 3.  Psych: Normal affect. Skin: Normal. Musculoskeletal: No gross deformities. Extremities: No  clubbing or cyanosis.   ECG: Most recent ECG reviewed.      ASSESSMENT AND PLAN: 1. Severe aortic stenosis: Palliative care approach is being pursued with symptom management. Continue Lasix 60 mg bid for associated chronic diastolic heart failure. Continue adequate HR control with diltiazem and digoxin.  2. CAD with CABG: Symptomatically stable. No changes to therapy.  3. Atrial fibrillation: Anticoagulated with Pradaxa. Continue adequate HR control with diltiazem and digoxin.  4. Chronic diastolic heart failure: Euvolemic. Continue Lasix 60 mg bid and instructed to monitor for weight gain of 3 lbs in 24 hours or increasing shortness of breath and leg swelling. Continue compression stocking use and HR control.  Dispo: f/u 6 months.  Kate Sable, M.D., F.A.C.C.

## 2016-02-16 ENCOUNTER — Telehealth: Payer: Self-pay

## 2016-02-16 NOTE — Telephone Encounter (Signed)
Daughter walked in and stated her dad had an episode of CP yesterday and once during the night.the home health aide only gave NTG on the second episode and it resolved the pain.I went over the 3 NTG rule with her and we also talked about hospice.Would you be opposed in enrolling him with them? He was once before enrolled but timed out after 6 months

## 2016-02-17 NOTE — Telephone Encounter (Signed)
That would be advisable.

## 2016-02-18 ENCOUNTER — Telehealth: Payer: Self-pay

## 2016-02-18 DIAGNOSIS — I350 Nonrheumatic aortic (valve) stenosis: Secondary | ICD-10-CM

## 2016-02-18 NOTE — Telephone Encounter (Signed)
Daughter called and asked if we can place referral to [facility] for her father. She thinks the time is upon [place].

## 2016-02-18 NOTE — Telephone Encounter (Signed)
Please proceed with referral. 

## 2016-02-19 NOTE — Telephone Encounter (Signed)
I spoke with Beth at [facility]osp[facility] this am,faxed info to her

## 2016-02-19 NOTE — Addendum Note (Signed)
Addended by: Barbarann Ehlers A on: 02/19/2016 07:41 AM   Modules accepted: Orders

## 2016-02-19 NOTE — Telephone Encounter (Signed)
LM for [name] that I spoke with Hospice

## 2016-02-19 NOTE — Telephone Encounter (Signed)
Referral placed.

## 2016-03-07 ENCOUNTER — Ambulatory Visit: Payer: Medicare Other | Admitting: Internal Medicine

## 2016-03-14 DEATH — deceased
